# Patient Record
Sex: Male | Born: 1985 | Race: Black or African American | Hispanic: No | Marital: Single | Smoking: Never smoker
Health system: Southern US, Community
[De-identification: ages and names within clinical notes are randomized; demographics above are authoritative.]

## PROBLEM LIST (undated history)

## (undated) DIAGNOSIS — J45909 Unspecified asthma, uncomplicated: Secondary | ICD-10-CM

## (undated) DIAGNOSIS — I10 Essential (primary) hypertension: Secondary | ICD-10-CM

## (undated) DIAGNOSIS — J302 Other seasonal allergic rhinitis: Secondary | ICD-10-CM

## (undated) HISTORY — DX: Essential (primary) hypertension: I10

---
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
## 2013-01-19 ENCOUNTER — Other Ambulatory Visit: Payer: Self-pay | Admitting: Internal Medicine

## 2013-01-19 DIAGNOSIS — R1030 Lower abdominal pain, unspecified: Secondary | ICD-10-CM

## 2013-02-01 ENCOUNTER — Other Ambulatory Visit: Payer: Self-pay

## 2013-04-15 ENCOUNTER — Emergency Department (HOSPITAL_COMMUNITY)
Admission: EM | Admit: 2013-04-15 | Discharge: 2013-04-16 | Disposition: A | Discharge status: Home / Self Care | Payer: BC Managed Care – PPO | Attending: Emergency Medicine | Admitting: Emergency Medicine

## 2013-04-15 ENCOUNTER — Encounter (HOSPITAL_COMMUNITY): Payer: Self-pay | Admitting: Emergency Medicine

## 2013-04-15 DIAGNOSIS — R102 Pelvic and perineal pain: Secondary | ICD-10-CM

## 2013-04-15 DIAGNOSIS — J45909 Unspecified asthma, uncomplicated: Secondary | ICD-10-CM | POA: Insufficient documentation

## 2013-04-15 DIAGNOSIS — R350 Frequency of micturition: Secondary | ICD-10-CM | POA: Insufficient documentation

## 2013-04-15 DIAGNOSIS — R109 Unspecified abdominal pain: Secondary | ICD-10-CM | POA: Insufficient documentation

## 2013-04-15 DIAGNOSIS — Z9089 Acquired absence of other organs: Secondary | ICD-10-CM | POA: Insufficient documentation

## 2013-04-15 HISTORY — DX: Other seasonal allergic rhinitis: J30.2

## 2013-04-15 HISTORY — DX: Unspecified asthma, uncomplicated: J45.909

## 2013-04-15 LAB — URINALYSIS, ROUTINE W REFLEX MICROSCOPIC
Bilirubin Urine: NEGATIVE
Glucose, UA: NEGATIVE mg/dL
Hgb urine dipstick: NEGATIVE
Ketones, ur: NEGATIVE mg/dL
LEUKOCYTES UA: NEGATIVE
NITRITE: NEGATIVE
PROTEIN: NEGATIVE mg/dL
SPECIFIC GRAVITY, URINE: 1.016 (ref 1.005–1.030)
UROBILINOGEN UA: 1 mg/dL (ref 0.0–1.0)
pH: 5.5 (ref 5.0–8.0)

## 2013-04-15 LAB — GLUCOSE, CAPILLARY: Glucose-Capillary: 86 mg/dL (ref 70–99)

## 2013-04-15 NOTE — ED Notes (Signed)
Pt reports sharp suprapubic pain and frequent urination x1 week - pt denies any fever/chills/bodyaches or penile discharge.

## 2013-04-16 LAB — GC/CHLAMYDIA PROBE AMP
CT PROBE, AMP APTIMA: NEGATIVE
GC PROBE AMP APTIMA: NEGATIVE

## 2013-04-16 NOTE — ED Provider Notes (Signed)
Medical screening examination/treatment/procedure(s) were performed by non-physician practitioner and as supervising physician I was immediately available for consultation/collaboration.    Sunnie NielsenBrian Shallyn Constancio, MD 04/16/13 [phone]

## 2013-04-16 NOTE — Discharge Instructions (Signed)
1. Medications: usual home medications 2. Treatment: rest, drink plenty of fluids,  3. Follow Up: Please followup with your primary doctor for discussion of your diagnoses and further evaluation after today's visit; if you do not have a primary care doctor use the resource guide provided to find one;     Urinary Frequency The number of times a normal person urinates depends upon how much liquid they take in and how much liquid they are losing. If the temperature is hot and there is high humidity then the person will sweat more and usually breathe a little more frequently. These factors decrease the amount of frequency of urination that would be considered normal. The amount you drink is easily determined, but the amount of fluid lost is sometimes more difficult to calculate.  Fluid is lost in two ways:  Sensible fluid loss is usually measured by the amount of urine that you get rid of. Losses of fluid can also occur with diarrhea.  Insensible fluid loss is more difficult to measure. It is caused by evaporation. Insensible loss of fluid occurs through breathing and sweating. It usually ranges from a little less than a quart to a little more than a quart of fluid a day. In normal temperatures and activity levels the average person may urinate 4 to 7 times in a 24-hour period. Needing to urinate more often than that could indicate a problem. If one urinates 4 to 7 times in 24 hours and has large volumes each time, that could indicate a different problem from one who urinates 4 to 7 times a day and has small volumes. The time of urinating is also an important. Most urinating should be done during the waking hours. Getting up at night to urinate frequently can indicate some problems. CAUSES  The bladder is the organ in your lower abdomen that holds urine. Like a balloon, it swells some as it fills up. Your nerves sense this and tell you it is time to head for the bathroom. There are a number of reasons  that you might feel the need to urinate more often than usual. They include:  Urinary tract infection. This is usually associated with other signs such as burning when you urinate.  In men, problems with the prostate (a walnut-size gland that is located near the tube that carries urine out of your body). There are two reasons why the prostate can cause an increased frequency of urination:  An enlarged prostate that does not let the bladder empty well. If the bladder only half empties when you urinate then it only has half the capacity to fill before you have to urinate again.  The nerves in the bladder become more hypersensitive with an increased size of the prostate even if the bladder empties completely.  Pregnancy.  Obesity. Excess weight is more likely to cause a problem for women more than for men.  Bladder stones or other bladder problems.  Caffeine.  Alcohol.  Medications. For example, drugs that help the body get rid of extra fluid (diuretics) increase urine production. Some other medicines must be taken with lots of fluids.  Muscle or nerve weakness. This might be the result of a spinal cord injury, a stroke, multiple sclerosis or Parkinson's disease.  Long-standing diabetes can decrease the sensation of the bladder. This loss of sensation makes it harder to sense the bladder needs to be emptied. Over a period of years the bladder is stretched out by constant overfilling. This weakens the bladder muscles  so that the bladder does not empty well and has less capacity to fill with new urine.  Interstitial cystitis (also called painful bladder syndrome). This condition develops because the tissues that line the insider of the bladder are inflamed (inflammation is the body's way of reacting to injury or infection). It causes pain and frequent urination. It occurs in women more often than in men. DIAGNOSIS   To decide what might be causing your urinary frequency, your healthcare  provider will probably:  Ask about symptoms you have noticed.  Ask about your overall health. This will include questions about any medications you are taking.  Do a physical examination.  Order some tests. These might include:  A blood test to check for diabetes or other health issues that could be contributing to the problem.  Urine testing. This could measure the flow of urine and the pressure on the bladder.  A test of your neurological system (the brain, spinal cord and nerves). This is the system that senses the need to urinate.  A bladder test to check whether it is emptying completely when you urinate.  Cytoscopy. This test uses a thin tube with a tiny camera on it. It offers a look inside your urethra and bladder to see if there are problems.  Imaging tests. You might be given a contrast dye and then asked to urinate. X-rays are taken to see how your bladder is working. TREATMENT  It is important for you to be evaluated to determine if the amount or frequency that you have is unusual or abnormal. If it is found to be abnormal the cause should be determined and this can usually be found out easily. Depending upon the cause treatment could include medication, stimulation of the nerves, or surgery. There are not too many things that you can do as an individual to change your urinary frequency. It is important that you balance the amount of fluid intake needed to compensate for your activity and the temperature. Medical problems will be diagnosed and taken care of by your physician. There is no particular bladder training such as Kegel's exercises that you can do to help urinary frequency. This is an exercise this is usually done for people who have leaking of urine when they laugh cough or sneeze. HOME CARE INSTRUCTIONS   Take any medications your healthcare provider prescribed or suggested. Follow the directions carefully.  Practice any lifestyle changes that are recommended. These  might include:  Drinking less fluid or drinking at different times of the day. If you need to urinate often during the night, for example, you may need to stop drinking fluids early in the evening.  Cutting down on caffeine or alcohol. They both can make you need to urinate more often than normal. Caffeine is found in coffee, tea and sodas.  Losing weight, if that is recommended.  Keep a journal or a log. You might be asked to record how much you drink and when and when you feel the need to urinate. This will also help evaluate how well the treatment provided by your physician is working. SEEK MEDICAL CARE IF:   Your need to urinate often gets worse.  You feel increased pain or irritation when you urinate.  You notice blood in your urine.  You have questions about any medications that your healthcare provider recommended.  You notice blood, pus or swelling at the site of any test or treatment procedure.  You develop a fever of more than 100.5 F (  38.1 C). SEEK IMMEDIATE MEDICAL CARE IF:  You develop a fever of more than 102.0 F (38.9 C). Document Released: 12/14/2008 Document Revised: 05/12/2011 Document Reviewed: 12/14/2008 [facility] Patient Information 2014 [place], [place].

## 2013-04-16 NOTE — ED Provider Notes (Signed)
CSN: [id number]     Arrival date & time 04/15/13  2159 History   First MD Initiated Contact with Patient 04/15/13 2348     Chief Complaint  Patient presents with  . Abdominal Pain     (Consider location/radiation/quality/duration/timing/severity/associated sxs/prior Treatment) Patient is a 28 y.o. male presenting with abdominal pain. The history is provided by the patient and medical records. No language interpreter was used.  Abdominal Pain Associated symptoms: no chest pain, no constipation, no cough, no diarrhea, no dysuria, no fatigue, no fever, no hematuria, no nausea, no shortness of breath and no vomiting     Dale Patterson is a 28 y.o. male  with a hx of asthma, seasonal allergies presents to the Emergency Department complaining of gradual, persistent, progressively worsening urinary frequency onset 1 week ago. Associated symptoms include suprapubic abd pain.  Reports he had these symptoms several months ago and was seen by his primary care physician who ruled out a urinary tract infection and diabetes. The primary care recommended urology followup if the patient continued to have symptoms, but the symptoms resolved until just several days ago. Nothing makes it better and nothing makes it worse.  Pt denies fever, chills, headache, neck pain, chest pain, SOB, N/V/D, weakness, dizziness, syncope, hematuria.  Patient is sexually active with one male partner.  He reports this is his only lifetime partner, he has never had an STD and his partner lives out of town therefore he's had no sexual intercourse in approximately 2 months.   Past Medical History  Diagnosis Date  . Asthma   . Seasonal allergies    Past Surgical History  Procedure Laterality Date  . Appendectomy     No family history on file. History  Substance Use Topics  . Smoking status: Never Smoker   . Smokeless tobacco: Not on file  . Alcohol Use: Yes     Comment: occasionally    Review of Systems  Constitutional:  Negative for fever, diaphoresis, appetite change, fatigue and unexpected weight change.  HENT: Negative for mouth sores.   Eyes: Negative for visual disturbance.  Respiratory: Negative for cough, chest tightness, shortness of breath and wheezing.   Cardiovascular: Negative for chest pain.  Gastrointestinal: Positive for abdominal pain. Negative for nausea, vomiting, diarrhea and constipation.  Endocrine: Negative for polydipsia, polyphagia and polyuria.  Genitourinary: Positive for frequency. Negative for dysuria, urgency and hematuria.  Musculoskeletal: Negative for back pain and neck stiffness.  Skin: Negative for rash.  Allergic/Immunologic: Negative for immunocompromised state.  Neurological: Negative for syncope, light-headedness and headaches.  Hematological: Does not bruise/bleed easily.  Psychiatric/Behavioral: Negative for sleep disturbance. The patient is not nervous/anxious.       Allergies  Review of patient's allergies indicates no known allergies.  Home Medications   Current Outpatient Rx  Name  Route  Sig  Dispense  Refill  . Multiple Vitamin (MULTIVITAMIN WITH MINERALS) TABS tablet   Oral   Take 1 tablet by mouth daily.          BP 146/97  Pulse 64  Temp(Src) 98.4 F (36.9 C) (Oral)  Resp 16  Ht 5\' 11"  (1.803 m)  Wt 175 lb (79.379 kg)  BMI 24.42 kg/m2  SpO2 98% Physical Exam  Nursing note and vitals reviewed. Constitutional: He is oriented to person, place, and time. He appears well-developed and well-nourished. No distress.  Awake, alert, nontoxic appearance  HENT:  Head: Normocephalic and atraumatic.  Mouth/Throat: Oropharynx is clear and moist. No oropharyngeal exudate.  Eyes:  Conjunctivae are normal. No scleral icterus.  Neck: Normal range of motion. Neck supple.  Cardiovascular: Normal rate, regular rhythm, normal heart sounds and intact distal pulses.   No murmur heard. Pulmonary/Chest: Effort normal and breath sounds normal. No respiratory  distress. He has no wheezes.  Abdominal: Soft. Bowel sounds are normal. He exhibits no distension and no mass. There is tenderness in the suprapubic area. There is no rebound, no guarding and no CVA tenderness. Hernia confirmed negative in the right inguinal area and confirmed negative in the left inguinal area.    Mild abd tenderness without guarding, rebound or peritoneal signs in the very low suprapubic region.    Genitourinary: Testes normal and penis normal.    Cremasteric reflex is present. Right testis shows no mass, no swelling and no tenderness. Right testis is descended. Cremasteric reflex is not absent on the right side. Left testis shows no mass, no swelling and no tenderness. Left testis is descended. Cremasteric reflex is not absent on the left side. No phimosis, paraphimosis, hypospadias, penile erythema or penile tenderness. No discharge found.  Pt with most intense discomfort with palpation to the right of the penis.  No palpable direct/indirect inguinal hernia or femoral hernia; no adenopathy.  No erythema, induration or evidence of abscess or cellulitis.  Otherwise normal GU exam.    Musculoskeletal: Normal range of motion. He exhibits no edema.  Lymphadenopathy:       Right: No inguinal adenopathy present.       Left: No inguinal adenopathy present.  Neurological: He is alert and oriented to person, place, and time. He exhibits normal muscle tone. Coordination normal.  Speech is clear and goal oriented Moves extremities without ataxia  Skin: Skin is warm and dry. He is not diaphoretic. No erythema.  Psychiatric: He has a normal mood and affect.    ED Course  Procedures (including critical care time) Labs Review Labs Reviewed  GC/CHLAMYDIA PROBE AMP  URINALYSIS, ROUTINE W REFLEX MICROSCOPIC  GLUCOSE, CAPILLARY   Imaging Review No results found.  EKG Interpretation   None       MDM   Final diagnoses:  Urinary frequency  Suprapubic abdominal pain     Dale Patterson presents with urinary frequency and suprapubic pain it is not associated with his urination or dysuria.  UA without evidence of urinary tract infection.  CBG 86 and no evidence of diabetes. GC Chlamydia probe collected and pending. Patient is low risk for STD and I will not treat prophylactically today but we'll wait on culture.  No evidence of hernia on exam.  Pre-and post bladder scan with 84 ML's pre-void and 26 ML's post void.  No evidence of urinary retention.  Recommend urology followup for his symptoms at this time.  It has been determined that no acute conditions requiring further emergency intervention are present at this time. The patient/guardian have been advised of the diagnosis and plan. We have discussed signs and symptoms that warrant return to the ED, such as changes or worsening in symptoms.   Vital signs are stable at discharge.   BP 146/97  Pulse 64  Temp(Src) 98.4 F (36.9 C) (Oral)  Resp 16  Ht 5\' 11"  (1.803 m)  Wt 175 lb (79.379 kg)  BMI 24.42 kg/m2  SpO2 98%  Patient/guardian has voiced understanding and agreed to follow-up with the PCP or specialist.       Dierdre Forth, PA-C 04/16/13 0148

## 2013-04-16 NOTE — ED Notes (Signed)
Bladder scan: Pre-void=84 Post Void=26

## 2013-12-22 ENCOUNTER — Ambulatory Visit (INDEPENDENT_AMBULATORY_CARE_PROVIDER_SITE_OTHER): Payer: Self-pay | Admitting: Radiology

## 2013-12-22 ENCOUNTER — Ambulatory Visit (INDEPENDENT_AMBULATORY_CARE_PROVIDER_SITE_OTHER): Payer: BC Managed Care – PPO | Admitting: Neurology

## 2013-12-22 DIAGNOSIS — G609 Hereditary and idiopathic neuropathy, unspecified: Secondary | ICD-10-CM

## 2013-12-22 DIAGNOSIS — R202 Paresthesia of skin: Secondary | ICD-10-CM

## 2013-12-22 DIAGNOSIS — Z0289 Encounter for other administrative examinations: Secondary | ICD-10-CM

## 2013-12-22 NOTE — Progress Notes (Signed)
    [facility]    Provider:  Dr Lucia GaskinsAhern Referring Provider: Georgianne Fickamachandran, Ajith, MD  History: 28 year old who is here for evaluation of numbness. Numbness started 2 months ago in the feet and hands. Symptoms occur daily, some days feel like it is getting worse. More numbness,pain than tingling or burning. Symptoms are in the tips of all the fingers and all the toes. No radiation. Better when laying in bed and worse with walking. No neck pain, no low back pain. No cramping. No weakness, not dropping objects. Focused exam demonstrates decreased sensation to pin prick distally in the extremities, intact proprioception in the great toes, intact and symmetric reflexes and 5/5 DF/PF. Toes downgoing.  Summary  Nerve conduction studies were performed on the left upper and left lower extremities:  The left Median motor nerve showed normal conductions with normal F Wave latency The left Ulnar motor nerve showed normal conductions with normal F Wave latency The left Peroneal motor nerve showed normal conductions with normal F Wave latency The left Tibial motor nerve showed normal conductions with normal F Wave latency The left second-digit Median sensory nerve conduction was within normal limits The left fifth-digit Ulnar sensory nerve conduction was within normal limits The left Radial sensory nerve conduction was within normal limits The left Sural sensory nerve conduction was within normal limits Bilateral H Reflexes showed normal latencies The left Median/Ulnar (palm) comparison nerve showed normal peak latency difference  EMG Needle study was performed on selected left upper and left lower extremity muscles:   The Deltoid, Triceps, Pronator Teres, Opponens Pollicis, First Dorsal interosseous, Vastus Medialis, Anterior Tibialis, Medial Gastrocnemius, Extensor Hallucis Longus and Abductor Hallucis muscles were within normal limits.  Conclusion: This is a normal study. No  electrophysiologic evidence for ulnar or median neuropathy, peripheral polyneuropathy or radiculopathy.. There is no evidence of polyneuropathy however a small fiber neuropathy could be responsible for such symptoms and still evade detection by this study. Clinical correlation recommended.   Naomie DeanAntonia Ahern, MD  [facility] [address] [place], [place] [id number]  Phone [phone] Fax [phone]

## 2013-12-26 ENCOUNTER — Encounter: Payer: BC Managed Care – PPO | Admitting: Neurology

## 2014-02-19 ENCOUNTER — Encounter (HOSPITAL_COMMUNITY): Payer: Self-pay | Admitting: Emergency Medicine

## 2014-02-19 ENCOUNTER — Emergency Department (HOSPITAL_COMMUNITY)
Admission: EM | Admit: 2014-02-19 | Discharge: 2014-02-19 | Disposition: A | Discharge status: Home / Self Care | Payer: BC Managed Care – PPO | Attending: Emergency Medicine | Admitting: Emergency Medicine

## 2014-02-19 DIAGNOSIS — J45909 Unspecified asthma, uncomplicated: Secondary | ICD-10-CM | POA: Insufficient documentation

## 2014-02-19 DIAGNOSIS — R350 Frequency of micturition: Secondary | ICD-10-CM

## 2014-02-19 LAB — URINALYSIS, ROUTINE W REFLEX MICROSCOPIC
Bilirubin Urine: NEGATIVE
Glucose, UA: NEGATIVE mg/dL
Hgb urine dipstick: NEGATIVE
KETONES UR: NEGATIVE mg/dL
LEUKOCYTES UA: NEGATIVE
Nitrite: NEGATIVE
PROTEIN: NEGATIVE mg/dL
Specific Gravity, Urine: 1.016 (ref 1.005–1.030)
Urobilinogen, UA: 0.2 mg/dL (ref 0.0–1.0)
pH: 5 (ref 5.0–8.0)

## 2014-02-19 NOTE — ED Notes (Signed)
Pt arrived to the ED a complaint of urinary frequency.  Pt was seen 5 weeks ago for same given cipro but symptoms have not abated.  Pt also states he has pain in his pubis.

## 2014-02-19 NOTE — Discharge Instructions (Signed)
At times, urinary frequency and a sensation of incomplete bladder emptying may be secondary to benign prostatic hyperplasia (an enlarged prostate). Recommend your primary doctor conduct a PSA test to see if you may be at risk for BPH. Your work up today did not suggest a urinary tract infection or prostatitis. You have gonorrhea and chlamydia cultures pending. Follow-up with the health department in 48 hours for results of this test. Recommend a follow-up with urology for further evaluation of her symptoms. Return to the emergency department as needed if symptoms worsen.  Urinary Frequency The number of times a normal person urinates depends upon how much liquid they take in and how much liquid they are losing. If the temperature is hot and there is high humidity, then the person will sweat more and usually breathe a little more frequently. These factors decrease the amount of frequency of urination that would be considered normal. The amount you drink is easily determined, but the amount of fluid lost is sometimes more difficult to calculate.  Fluid is lost in two ways:  Sensible fluid loss is usually measured by the amount of urine that you get rid of. Losses of fluid can also occur with diarrhea.  Insensible fluid loss is more difficult to measure. It is caused by evaporation. Insensible loss of fluid occurs through breathing and sweating. It usually ranges from a little less than a quart to a little more than a quart of fluid a day. In normal temperatures and activity levels, the average person may urinate 4 to 7 times in a 24-hour period. Needing to urinate more often than that could indicate a problem. If one urinates 4 to 7 times in 24 hours and has large volumes each time, that could indicate a different problem from one who urinates 4 to 7 times a day and has small volumes. The time of urinating is also important. Most urinating should be done during the waking hours. Getting up at night to  urinate frequently can indicate some problems. CAUSES  The bladder is the organ in your lower abdomen that holds urine. Like a balloon, it swells some as it fills up. Your nerves sense this and tell you it is time to head for the bathroom. There are a number of reasons that you might feel the need to urinate more often than usual. They include:  Urinary tract infection. This is usually associated with other signs such as burning when you urinate.  In men, problems with the prostate (a walnut-size gland that is located near the tube that carries urine out of your body). There are two reasons why the prostate can cause an increased frequency of urination:  An enlarged prostate that does not let the bladder empty well. If the bladder only half empties when you urinate, then it only has half the capacity to fill before you have to urinate again.  The nerves in the bladder become more hypersensitive with an increased size of the prostate even if the bladder empties completely.  Pregnancy.  Obesity. Excess weight is more likely to cause a problem for women than for men.  Bladder stones or other bladder problems.  Caffeine.  Alcohol.  Medications. For example, drugs that help the body get rid of extra fluid (diuretics) increase urine production. Some other medicines must be taken with lots of fluids.  Muscle or nerve weakness. This might be the result of a spinal cord injury, a stroke, multiple sclerosis, or Parkinson disease.  Long-standing diabetes can decrease  the sensation of the bladder. This loss of sensation makes it harder to sense the bladder needs to be emptied. Over a period of years, the bladder is stretched out by constant overfilling. This weakens the bladder muscles so that the bladder does not empty well and has less capacity to fill with new urine.  Interstitial cystitis (also called painful bladder syndrome). This condition develops because the tissues that line the inside of  the bladder are inflamed (inflammation is the body's way of reacting to injury or infection). It causes pain and frequent urination. It occurs in women more often than in men. DIAGNOSIS   To decide what might be causing your urinary frequency, your health care provider will probably:  Ask about symptoms you have noticed.  Ask about your overall health. This will include questions about any medications you are taking.  Do a physical examination.  Order some tests. These might include:  A blood test to check for diabetes or other health issues that could be contributing to the problem.  Urine testing. This could measure the flow of urine and the pressure on the bladder.  A test of your neurological system (the brain, spinal cord, and nerves). This is the system that senses the need to urinate.  A bladder test to check whether it is emptying completely when you urinate.  Cystoscopy. This test uses a thin tube with a tiny camera on it. It offers a look inside your urethra and bladder to see if there are problems.  Imaging tests. You might be given a contrast dye and then asked to urinate. X-rays are taken to see how your bladder is working. TREATMENT  It is important for you to be evaluated to determine if the amount or frequency that you have is unusual or abnormal. If it is found to be abnormal, the cause should be determined and this can usually be found out easily. Depending upon the cause, treatment could include medication, stimulation of the nerves, or surgery. There are not too many things that you can do as an individual to change your urinary frequency. It is important that you balance the amount of fluid intake needed to compensate for your activity and the temperature. Medical problems will be diagnosed and taken care of by your physician. There is no particular bladder training such as Kegel exercises that you can do to help urinary frequency. This is an exercise that is usually  recommended for people who have leaking of urine when they laugh, cough, or sneeze. HOME CARE INSTRUCTIONS   Take any medications your health care provider prescribed or suggested. Follow the directions carefully.  Practice any lifestyle changes that are recommended. These might include:  Drinking less fluid or drinking at different times of the day. If you need to urinate often during the night, for example, you may need to stop drinking fluids early in the evening.  Cutting down on caffeine or alcohol. They both can make you need to urinate more often than normal. Caffeine is found in coffee, tea, and sodas.  Losing weight, if that is recommended.  Keep a journal or a log. You might be asked to record how much you drink and when and where you feel the need to urinate. This will also help evaluate how well the treatment provided by your physician is working. SEEK MEDICAL CARE IF:   Your need to urinate often gets worse.  You feel increased pain or irritation when you urinate.  You notice blood in  your urine.  You have questions about any medications that your health care provider recommended.  You notice blood, pus, or swelling at the site of any test or treatment procedure.  You develop a fever of more than 100.11F (38.1C). SEEK IMMEDIATE MEDICAL CARE IF:  You develop a fever of more than 102.59F (38.9C). Document Released: 12/14/2008 Document Revised: 07/04/2013 Document Reviewed: 12/14/2008 [facility] Patient Information 2015 [place], [place]. This information is not intended to replace advice given to you by your health care provider. Make sure you discuss any questions you have with your health care provider.

## 2014-02-19 NOTE — ED Provider Notes (Signed)
CSN: [id number]     Arrival date & time 02/19/14  0220 History   First MD Initiated Contact with Patient 02/19/14 0236     Chief Complaint  Patient presents with  . Urinary Frequency    (Consider location/radiation/quality/duration/timing/severity/associated sxs/prior Treatment) HPI Comments: Patient is a 28 year old male with no significant past medical history who presents to the emergency department for further evaluation of urinary frequency 5 weeks. Patient states that his symptoms have been associated with the sensation of incomplete bladder emptying. He denies any modifying factors of his symptoms and states that he saw his primary care doctor who thought his symptoms were secondary to prostatitis. Patient was put on a 6 day course of ciprofloxacin which she states did not alleviate his symptoms. He has been referred to urology, but is unable to follow up until the middle of January. He states he does intermittently feel an aching discomfort in his suprapubic region. He denies any associated fever, dysuria, hematuria, scrotal swelling, penile swelling or discharge, nausea, vomiting, or a history of unprotected sexual intercourse. He states that he has been sexually active with 2 partners in the last year, always with the use of condoms.  Patient is a 28 y.o. male presenting with frequency. The history is provided by the patient. No language interpreter was used.  Urinary Frequency Pertinent negatives include no abdominal pain, fever, nausea or vomiting.    Past Medical History  Diagnosis Date  . Asthma   . Seasonal allergies    Past Surgical History  Procedure Laterality Date  . Appendectomy     History reviewed. No pertinent family history. History  Substance Use Topics  . Smoking status: Never Smoker   . Smokeless tobacco: Not on file  . Alcohol Use: Yes     Comment: occasionally    Review of Systems  Constitutional: Negative for fever.  Gastrointestinal: Negative for  nausea, vomiting and abdominal pain.  Genitourinary: Positive for frequency. Negative for dysuria, penile swelling, scrotal swelling, penile pain and testicular pain.       +sensation of incomplete bladder emptying  All other systems reviewed and are negative.   Allergies  Review of patient's allergies indicates no known allergies.  Home Medications   Prior to Admission medications   Medication Sig Start Date End Date Taking? Authorizing Provider  Multiple Vitamin (MULTIVITAMIN WITH MINERALS) TABS tablet Take 1 tablet by mouth daily.    Historical Provider, MD   BP 129/85 mmHg  Pulse 72  Temp(Src) 97.8 F (36.6 C) (Oral)  Resp 19  SpO2 100%   Physical Exam  Constitutional: He is oriented to person, place, and time. He appears well-developed and well-nourished. No distress.  Nontoxic/nonseptic appearing  HENT:  Head: Normocephalic and atraumatic.  Eyes: Conjunctivae and EOM are normal. No scleral icterus.  Neck: Normal range of motion.  Pulmonary/Chest: Effort normal. No respiratory distress.  Respirations even and unlabored  Abdominal: Soft. He exhibits no distension. There is no tenderness. There is no rebound. Hernia confirmed negative in the right inguinal area and confirmed negative in the left inguinal area.  Soft, nontender. No masses.  Genitourinary: Prostate normal, testes normal and penis normal. Rectal exam shows no tenderness and anal tone normal. Prostate is not tender. Right testis shows no mass, no swelling and no tenderness. Right testis is descended. Left testis shows no mass, no swelling and no tenderness. Left testis is descended. Circumcised. No penile erythema or penile tenderness. No discharge found.  Chaperoned GU exam negative for boggy  or tender prostate. Palpated prostate does not feel enlarged. No penile or testicular TTP. No inguinal lymphadenopathy or hernia.  Musculoskeletal: Normal range of motion.  Lymphadenopathy:       Right: No inguinal  adenopathy present.       Left: No inguinal adenopathy present.  Neurological: He is alert and oriented to person, place, and time. He exhibits normal muscle tone. Coordination normal.  Skin: Skin is warm and dry. No rash noted. He is not diaphoretic. No erythema. No pallor.  Psychiatric: He has a normal mood and affect. His behavior is normal.  Nursing note and vitals reviewed.   ED Course  Procedures (including critical care time) Labs Review Labs Reviewed  URINALYSIS, ROUTINE W REFLEX MICROSCOPIC - Abnormal; Notable for the following:    APPearance CLOUDY (*)    All other components within normal limits  GC/CHLAMYDIA PROBE AMP    Imaging Review No results found.   EKG Interpretation None      MDM   Final diagnoses:  Urinary frequency    Patient presents to the emergency department for further evaluation of urinary frequency with a sensation of incomplete bladder emptying. Patient has been put on a child with ciprofloxacin by his primary care doctor with no relief of his symptoms. He was instructed to follow-up with urology, but was unable to get an appointment until the middle of January. Patient's physical exam today is largely unremarkable. His urinalysis does not suggest infection. Gonorrhea and chlamydia culture pending, though I have a low suspicion for STDs in this patient. Patient does not have a tender or boggy prostate on chaperoned rectal exam. Also low suspicion for acute prostatitis. Patient to be referred to urology for further evaluation of symptoms. Have recommended that the patient have his primary care doctor run a PSA test to evaluate for possible BPH. Return precautions discussed and provided. Patient agreeable to plan with no unaddressed concerns. Patient discharged in good condition; VSS.   Filed Vitals:   02/19/14 0229 02/19/14 0333  BP: 132/88 129/85  Pulse: 66 72  Temp: 97.8 F (36.6 C) 97.8 F (36.6 C)  TempSrc: Oral Oral  Resp: 16 19  SpO2: 99%  100%     Antony MaduraKelly Lousie Calico, PA-C 02/19/14 0345  Derwood KaplanAnkit Nanavati, MD 02/19/14 [phone]

## 2014-02-21 LAB — GC/CHLAMYDIA PROBE AMP
CT PROBE, AMP APTIMA: NEGATIVE
GC Probe RNA: NEGATIVE

## 2014-05-27 ENCOUNTER — Encounter (HOSPITAL_COMMUNITY): Payer: Self-pay | Admitting: Emergency Medicine

## 2014-05-27 ENCOUNTER — Emergency Department (HOSPITAL_COMMUNITY)
Admission: EM | Admit: 2014-05-27 | Discharge: 2014-05-27 | Disposition: A | Discharge status: Home / Self Care | Payer: BLUE CROSS/BLUE SHIELD | Attending: Emergency Medicine | Admitting: Emergency Medicine

## 2014-05-27 ENCOUNTER — Emergency Department (HOSPITAL_COMMUNITY): Payer: BLUE CROSS/BLUE SHIELD

## 2014-05-27 DIAGNOSIS — J111 Influenza due to unidentified influenza virus with other respiratory manifestations: Secondary | ICD-10-CM

## 2014-05-27 DIAGNOSIS — R531 Weakness: Secondary | ICD-10-CM | POA: Insufficient documentation

## 2014-05-27 DIAGNOSIS — J45909 Unspecified asthma, uncomplicated: Secondary | ICD-10-CM | POA: Insufficient documentation

## 2014-05-27 DIAGNOSIS — M2550 Pain in unspecified joint: Secondary | ICD-10-CM | POA: Insufficient documentation

## 2014-05-27 DIAGNOSIS — R51 Headache: Secondary | ICD-10-CM | POA: Diagnosis present

## 2014-05-27 DIAGNOSIS — R509 Fever, unspecified: Secondary | ICD-10-CM | POA: Insufficient documentation

## 2014-05-27 DIAGNOSIS — R5383 Other fatigue: Secondary | ICD-10-CM | POA: Insufficient documentation

## 2014-05-27 DIAGNOSIS — R42 Dizziness and giddiness: Secondary | ICD-10-CM | POA: Diagnosis not present

## 2014-05-27 DIAGNOSIS — I951 Orthostatic hypotension: Secondary | ICD-10-CM | POA: Diagnosis not present

## 2014-05-27 DIAGNOSIS — R69 Illness, unspecified: Secondary | ICD-10-CM

## 2014-05-27 DIAGNOSIS — Z79899 Other long term (current) drug therapy: Secondary | ICD-10-CM | POA: Diagnosis not present

## 2014-05-27 DIAGNOSIS — R630 Anorexia: Secondary | ICD-10-CM | POA: Insufficient documentation

## 2014-05-27 DIAGNOSIS — Z7951 Long term (current) use of inhaled steroids: Secondary | ICD-10-CM | POA: Diagnosis not present

## 2014-05-27 DIAGNOSIS — M791 Myalgia: Secondary | ICD-10-CM | POA: Insufficient documentation

## 2014-05-27 LAB — CBC WITH DIFFERENTIAL/PLATELET
BASOS ABS: 0 10*3/uL (ref 0.0–0.1)
BASOS PCT: 0 % (ref 0–1)
EOS ABS: 0 10*3/uL (ref 0.0–0.7)
Eosinophils Relative: 0 % (ref 0–5)
HCT: 44 % (ref 39.0–52.0)
Hemoglobin: 15.1 g/dL (ref 13.0–17.0)
Lymphocytes Relative: 9 % — ABNORMAL LOW (ref 12–46)
Lymphs Abs: 0.6 10*3/uL — ABNORMAL LOW (ref 0.7–4.0)
MCH: 27.9 pg (ref 26.0–34.0)
MCHC: 34.3 g/dL (ref 30.0–36.0)
MCV: 81.3 fL (ref 78.0–100.0)
Monocytes Absolute: 0.3 10*3/uL (ref 0.1–1.0)
Monocytes Relative: 5 % (ref 3–12)
NEUTROS ABS: 5.5 10*3/uL (ref 1.7–7.7)
Neutrophils Relative %: 86 % — ABNORMAL HIGH (ref 43–77)
Platelets: 160 10*3/uL (ref 150–400)
RBC: 5.41 MIL/uL (ref 4.22–5.81)
RDW: 13 % (ref 11.5–15.5)
WBC: 6.5 10*3/uL (ref 4.0–10.5)

## 2014-05-27 LAB — COMPREHENSIVE METABOLIC PANEL
ALBUMIN: 4.1 g/dL (ref 3.5–5.2)
ALT: 33 U/L (ref 0–53)
AST: 28 U/L (ref 0–37)
Alkaline Phosphatase: 51 U/L (ref 39–117)
Anion gap: 7 (ref 5–15)
BUN: 13 mg/dL (ref 6–23)
CHLORIDE: 105 mmol/L (ref 96–112)
CO2: 24 mmol/L (ref 19–32)
Calcium: 8.5 mg/dL (ref 8.4–10.5)
Creatinine, Ser: 1.15 mg/dL (ref 0.50–1.35)
GFR calc Af Amer: >90 mL/min (ref >90)
GFR calc non Af Amer: 85 mL/min — ABNORMAL LOW (ref >90)
Glucose, Bld: 98 mg/dL (ref 70–99)
POTASSIUM: 4 mmol/L (ref 3.5–5.1)
SODIUM: 136 mmol/L (ref 135–145)
TOTAL PROTEIN: 7.4 g/dL (ref 6.0–8.3)
Total Bilirubin: 0.8 mg/dL (ref 0.3–1.2)

## 2014-05-27 LAB — URINALYSIS, ROUTINE W REFLEX MICROSCOPIC
Bilirubin Urine: NEGATIVE
GLUCOSE, UA: NEGATIVE mg/dL
Hgb urine dipstick: NEGATIVE
Ketones, ur: NEGATIVE mg/dL
Leukocytes, UA: NEGATIVE
NITRITE: NEGATIVE
PH: 5 (ref 5.0–8.0)
PROTEIN: NEGATIVE mg/dL
Specific Gravity, Urine: 1.019 (ref 1.005–1.030)
Urobilinogen, UA: 1 mg/dL (ref 0.0–1.0)

## 2014-05-27 MED ORDER — IBUPROFEN 800 MG PO TABS
800.0000 mg | ORAL_TABLET | Freq: Once | ORAL | Status: AC
  Administered 2014-05-27: 800 mg via ORAL
  Filled 2014-05-27: qty 1

## 2014-05-27 MED ORDER — SODIUM CHLORIDE 0.9 % IV BOLUS (SEPSIS)
1000.0000 mL | Freq: Once | INTRAVENOUS | Status: AC
  Administered 2014-05-27: 1000 mL via INTRAVENOUS

## 2014-05-27 NOTE — ED Notes (Signed)
Pt from home vis EMS c/o HA and chills that started today. Pt has not taken any OTC meds for relief. Pt is A&O and in NAD

## 2014-05-27 NOTE — ED Notes (Signed)
Bed: [id number] Expected date: 05/27/14 Expected time: 10:24 AM Means of arrival: Ambulance Comments: Weakness/orthostatic changes

## 2014-05-27 NOTE — Discharge Instructions (Signed)
Viral Infections Keep yourself hydrated. Follow up with your doctor. Return to the ED if you develop new or worsening symptoms. A viral infection can be caused by different types of viruses.Most viral infections are not serious and resolve on their own. However, some infections may cause severe symptoms and may lead to further complications. SYMPTOMS Viruses can frequently cause:  Minor sore throat.  Aches and pains.  Headaches.  Runny nose.  Different types of rashes.  Watery eyes.  Tiredness.  Cough.  Loss of appetite.  Gastrointestinal infections, resulting in nausea, vomiting, and diarrhea. These symptoms do not respond to antibiotics because the infection is not caused by bacteria. However, you might catch a bacterial infection following the viral infection. This is sometimes called a "superinfection." Symptoms of such a bacterial infection may include:  Worsening sore throat with pus and difficulty swallowing.  Swollen neck glands.  Chills and a high or persistent fever.  Severe headache.  Tenderness over the sinuses.  Persistent overall ill feeling (malaise), muscle aches, and tiredness (fatigue).  Persistent cough.  Yellow, green, or brown mucus production with coughing. HOME CARE INSTRUCTIONS   Only take over-the-counter or prescription medicines for pain, discomfort, diarrhea, or fever as directed by your caregiver.  Drink enough water and fluids to keep your urine clear or pale yellow. Sports drinks can provide valuable electrolytes, sugars, and hydration.  Get plenty of rest and maintain proper nutrition. Soups and broths with crackers or rice are fine. SEEK IMMEDIATE MEDICAL CARE IF:   You have severe headaches, shortness of breath, chest pain, neck pain, or an unusual rash.  You have uncontrolled vomiting, diarrhea, or you are unable to keep down fluids.  You or your child has an oral temperature above 102 F (38.9 C), not controlled by  medicine.  Your baby is older than 3 months with a rectal temperature of 102 F (38.9 C) or higher.  Your baby is 453 months old or younger with a rectal temperature of 100.4 F (38 C) or higher. MAKE SURE YOU:   Understand these instructions.  Will watch your condition.  Will get help right away if you are not doing well or get worse. Document Released: 11/27/2004 Document Revised: 05/12/2011 Document Reviewed: 06/24/2010 [facility] Patient Information 2015 [place], [place]. This information is not intended to replace advice given to you by your health care provider. Make sure you discuss any questions you have with your health care provider.

## 2014-05-27 NOTE — ED Notes (Signed)
Pt sts that he woke up this am dizzy with a HA. Pt denies N/V/D. Pt is A&O and in NAD

## 2014-05-27 NOTE — ED Provider Notes (Signed)
CSN: [id number]     Arrival date & time 05/27/14  1026 History   First MD Initiated Contact with Patient 05/27/14 1042     Chief Complaint  Patient presents with  . Headache  . Chills     (Consider location/radiation/quality/duration/timing/severity/associated sxs/prior Treatment) HPI Comments: Patient from home with generalized weakness, dizziness, headache and body aches since this morning. He felt fine when he went to bed last night. Did not check his temperature. Endorses diffuse headache with body aches and lightheadedness. Denies any nausea, vomiting, diarrhea. No chest pain or shortness of breath. No cough. No runny nose or sore throat. Denies any chronic medical problems other than hypertension and acid reflux. He does not take any blood pressure medications. He did not receive a flu shot. Denies any sick contacts.  The history is provided by the patient and the EMS personnel.    Past Medical History  Diagnosis Date  . Asthma   . Seasonal allergies    Past Surgical History  Procedure Laterality Date  . Appendectomy     No family history on file. History  Substance Use Topics  . Smoking status: Never Smoker   . Smokeless tobacco: Not on file  . Alcohol Use: Yes     Comment: occasionally    Review of Systems  Constitutional: Positive for fever, chills, activity change, appetite change and fatigue.  HENT: Negative for congestion and rhinorrhea.   Respiratory: Negative for cough, chest tightness and shortness of breath.   Cardiovascular: Negative for chest pain.  Gastrointestinal: Negative for nausea, vomiting and abdominal pain.  Genitourinary: Negative for dysuria and hematuria.  Musculoskeletal: Positive for myalgias and arthralgias. Negative for neck stiffness.  Skin: Negative for rash.  Neurological: Positive for weakness, light-headedness and headaches.  A complete 10 system review of systems was obtained and all systems are negative except as noted in the HPI  and PMH.      Allergies  Review of patient's allergies indicates no known allergies.  Home Medications   Prior to Admission medications   Medication Sig Start Date End Date Taking? Authorizing Provider  acetaminophen (TYLENOL) 500 MG tablet Take 500 mg by mouth every 6 (six) hours as needed for mild pain.   Yes Historical Provider, MD  fluticasone (FLONASE) 50 MCG/ACT nasal spray Place 1 spray into both nostrils daily.  04/24/14  Yes Historical Provider, MD  Multiple Vitamin (MULTIVITAMIN WITH MINERALS) TABS tablet Take 1 tablet by mouth daily.   Yes Historical Provider, MD  omeprazole (PRILOSEC) 40 MG capsule Take 40 mg by mouth daily.  05/20/14  Yes Historical Provider, MD  vitamin B-12 (CYANOCOBALAMIN) 1000 MCG tablet Take 1,000 mcg by mouth daily.   Yes Historical Provider, MD   BP 121/73 mmHg  Pulse 85  Temp(Src) 98.7 F (37.1 C) (Oral)  Resp 18  SpO2 98% Physical Exam  Constitutional: He is oriented to person, place, and time. He appears well-developed and well-nourished. No distress.  HENT:  Head: Normocephalic and atraumatic.  Mouth/Throat: Oropharynx is clear and moist. No oropharyngeal exudate.  Eyes: Conjunctivae and EOM are normal. Pupils are equal, round, and reactive to light.  Neck: Normal range of motion. Neck supple.  No meningismus.  Cardiovascular: Normal rate, regular rhythm, normal heart sounds and intact distal pulses.   No murmur heard. Pulmonary/Chest: Effort normal and breath sounds normal. No respiratory distress. He exhibits no tenderness.  Abdominal: Soft. There is no tenderness. There is no rebound and no guarding.  Musculoskeletal: Normal range of  motion. He exhibits no edema or tenderness.  Neurological: He is alert and oriented to person, place, and time. No cranial nerve deficit. He exhibits normal muscle tone. Coordination normal.  No ataxia on finger to nose bilaterally. No pronator drift. 5/5 strength throughout. CN 2-12 intact. Negative  Romberg. Equal grip strength. Sensation intact. Gait is normal.   Skin: Skin is warm.  Psychiatric: He has a normal mood and affect. His behavior is normal.  Nursing note and vitals reviewed.   ED Course  Procedures (including critical care time) Labs Review Labs Reviewed  CBC WITH DIFFERENTIAL/PLATELET - Abnormal; Notable for the following:    Neutrophils Relative % 86 (*)    Lymphocytes Relative 9 (*)    Lymphs Abs 0.6 (*)    All other components within normal limits  COMPREHENSIVE METABOLIC PANEL - Abnormal; Notable for the following:    GFR calc non Af Amer 85 (*)    All other components within normal limits  URINALYSIS, ROUTINE W REFLEX MICROSCOPIC    Imaging Review Dg Chest 2 View  05/27/2014   CLINICAL DATA:  Chills and headache.  Fever  EXAM: CHEST  2 VIEW  COMPARISON:  None.  FINDINGS: Lungs are clear. Heart size and pulmonary vascularity are normal. No adenopathy. 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  IMPRESSION: No edema or consolidation.   Electronically Signed   By: Bretta BangWilliam  Woodruff III M.D.   On: 05/27/2014 11:21     EKG Interpretation None      MDM   Final diagnoses:  Influenza-like illness  Orthostasis   Chills, dizziness, lightheadedness, body aches and headache since this morning. Afebrile nontoxic appearing moist mucous membranes. Vital stable.  Suspect viral syndrome, possibly influenza. Orthostatics are positive by heart rate. No meningismus.  Patient given IV fluids. He is tolerating by mouth by mouth. Heart rate is improved to 85. Chest x-ray negative, labs reassuring.  Suspect viral syndrome, possibly influenza. Risks and benefits of Tamiflu discussed with patient. He declines. Supportive care at home with by mouth fluids, antipyretics and follow up with PCP. Return precautions discussed.   Glynn OctaveStephen Addie Cederberg, MD 05/27/14 [phone]

## 2014-06-12 ENCOUNTER — Encounter: Payer: Self-pay | Admitting: Neurology

## 2014-06-12 ENCOUNTER — Ambulatory Visit (INDEPENDENT_AMBULATORY_CARE_PROVIDER_SITE_OTHER): Payer: BLUE CROSS/BLUE SHIELD | Admitting: Neurology

## 2014-06-12 VITALS — BP 130/82 | HR 90 | Temp 98.2°F | Ht 71.0 in | Wt 179.0 lb

## 2014-06-12 DIAGNOSIS — R519 Headache, unspecified: Secondary | ICD-10-CM

## 2014-06-12 DIAGNOSIS — R51 Headache: Secondary | ICD-10-CM | POA: Diagnosis not present

## 2014-06-12 DIAGNOSIS — G4452 New daily persistent headache (NDPH): Secondary | ICD-10-CM

## 2014-06-12 MED ORDER — NORTRIPTYLINE HCL 10 MG PO CAPS: 20.0000 mg | ORAL_CAPSULE | Freq: Every day | ORAL | Status: DC

## 2014-06-12 NOTE — Progress Notes (Signed)
[facility]    Provider:  Dr Lucia Gaskins Referring Provider: Georgianne Fick, MD Primary Care Physician:  Georgianne Fick, MD  CC:  Headache  HPI:  Dale Patterson is a 29 y.o. male here as a referral from Dr. Nicholos Johns for Headache. He was seen in the office last year for paresthesias that have since resolved. Today he returns for Migraines and pain in the head. No history of migraines. 3-4 weeks ago started having headaches. One morning he woke up with a headache. Since then daily headache. Tylenol. Throbbing, pulsating. Can last 20-30 minutes.  every day. Different parts of the head. multipe times a day. He has stress from his job but nothing else. No new medications. Taking a muscle relaxer and hydrocodone. Can get to an 8/10 in pain. Hydrocodone helps. No light sensitivity. No sound sensitivity. Has sharp shooting pains behind the eyes. Has difficulty getting to sleep at night. Sleeping 5-6 hours a night. He has some increased stress in his life currently.   Reviewed notes, labs and imaging from outside physicians, which showed: CMP unremarkable  Review of Systems: Patient complains of symptoms per HPI as well as the following symptoms: eye pain, headache. Pertinent negatives per HPI. All others negative.   History   Social History  . Marital Status: Single    Spouse Name: N/A  . Number of Children: 0  . Years of Education: Assocaites   Occupational History  . Certified opthalamic assistant     Social History Main Topics  . Smoking status: Never Smoker   . Smokeless tobacco: Not on file  . Alcohol Use: 0.0 oz/week    0 Standard drinks or equivalent per week     Comment: 3-4 drinks per week  . Drug Use: No  . Sexual Activity: Yes   Other Topics Concern  . Not on file   Social History Narrative   Lives at home by himself.   Right handed.    Caffeine use: 2-3 cups of coffee/week    Family History  Problem Relation Age of Onset  . Diabetes  Maternal Grandmother   . Hypertension Mother   . Hypertension Father   . High Cholesterol Father     Past Medical History  Diagnosis Date  . Asthma   . Seasonal allergies   . Hypertension     Past Surgical History  Procedure Laterality Date  . Appendectomy  2001  . Hernia repair  1992    Current Outpatient Prescriptions  Medication Sig Dispense Refill  . acetaminophen (TYLENOL) 500 MG tablet Take 500 mg by mouth every 6 (six) hours as needed for mild pain.    . cyclobenzaprine (FLEXERIL) 10 MG tablet Take 10 mg by mouth every 8 (eight) hours.  1  . fluticasone (FLONASE) 50 MCG/ACT nasal spray Place 1 spray into both nostrils daily.   5  . HYDROcodone-acetaminophen (NORCO) 10-325 MG per tablet Take 1 tablet by mouth every 6 (six) hours as needed.  0  . Multiple Vitamin (MULTIVITAMIN WITH MINERALS) TABS tablet Take 1 tablet by mouth daily.    Marland Kitchen omeprazole (PRILOSEC) 40 MG capsule Take 40 mg by mouth daily.   5  . vitamin B-12 (CYANOCOBALAMIN) 1000 MCG tablet Take 1,000 mcg by mouth daily.     No current facility-administered medications for this visit.    Allergies as of 06/12/2014  . (No Known Allergies)    Vitals: BP 130/82 mmHg  Pulse 90  Temp(Src) 98.2 F (36.8 C)  Ht  (1.803  m)  Wt 179 lb (81.194 kg)  BMI 24.98 kg/m2 Last Weight:  Wt Readings from Last 1 Encounters:  06/12/14 179 lb (81.194 kg)   Last Height:   Ht Readings from Last 1 Encounters:  06/12/14 5\' 11"  (1.803 m)   Physical exam: Exam: Gen: NAD, conversant, well nourised, well groomed                     CV: RRR, no MRG. No Carotid Bruits. No peripheral edema, warm, nontender Eyes: Conjunctivae clear without exudates or hemorrhage  Neuro: Detailed Neurologic Exam  Speech:    Speech is normal; fluent and spontaneous with normal comprehension.  Cognition:    The patient is oriented to person, place, and time;     recent and remote memory intact;     language fluent;     normal  attention, concentration,     fund of knowledge Cranial Nerves:    The pupils are equal, round, and reactive to light. The fundi are normal and spontaneous venous pulsations are present. Visual fields are full to finger confrontation. Extraocular movements are intact. Trigeminal sensation is intact and the muscles of mastication are normal. The face is symmetric. The palate elevates in the midline. Hearing intact. Voice is normal. Shoulder shrug is normal. The tongue has normal motion without fasciculations.   Coordination:    Normal finger to nose and heel to shin. Normal rapid alternating movements.   Gait:    Heel-toe and tandem gait are normal.   Motor Observation:    No asymmetry, no atrophy, and no involuntary movements noted. Tone:    Normal muscle tone.    Posture:    Posture is normal. normal erect    Strength:    Strength is V/V in the upper and lower limbs.      Sensation: intact to LT     Reflex Exam:  DTR's:    Deep tendon reflexes in the upper and lower extremities are normal bilaterally.   Toes:    The toes are downgoing bilaterally.   Clonus:    Clonus is absent.  Assessment/Plan:  29 year old male here with daily headaches in the setting of new stress and poor sleeping. Neuro exam normal. Will start Nortriptyline 10mg  around bedtime. In one week can increase to 20mg  if needed.As far as diagnostic testing: CT head.   Naomie DeanAntonia Rynell Ciotti, MD  [facility] [address] [place] [id number]  Phone [phone] Fax [phone]

## 2014-06-12 NOTE — Patient Instructions (Signed)
Overall you are doing fairly well but I do want to suggest a few things today:   Remember to drink plenty of fluid, eat healthy meals and do not skip any meals. Try to eat protein with a every meal and eat a healthy snack such as fruit or nuts in between meals. Try to keep a regular sleep-wake schedule and try to exercise daily, particularly in the form of walking, 20-30 minutes a day, if you can.   As far as your medications are concerned, I would like to suggest: Nortriptyline 10mg  around bedtime. In one week can increase to 20mg  if needed.  As far as diagnostic testing: CT head  I would like to see you back in 3 months, sooner if we need to. Please call us with any interim questions, concerns, problems, updates or refill requests.   Please also call us for any test results so we can go over those with you on the phone.  My clinical assistant and will answer any of your questions and relay your messages to me and also relay most of my messages to you.   Our phone number is [phone]. We also have an after hours call service for urgent matters and there is a physician on-call for urgent questions. For any emergencies you know to call 911 or go to the nearest emergency room

## 2014-06-17 DIAGNOSIS — R519 Headache, unspecified: Secondary | ICD-10-CM | POA: Insufficient documentation

## 2014-06-17 DIAGNOSIS — R51 Headache: Secondary | ICD-10-CM

## 2014-11-25 ENCOUNTER — Encounter (HOSPITAL_COMMUNITY): Payer: Self-pay | Admitting: Oncology

## 2014-11-25 ENCOUNTER — Emergency Department (HOSPITAL_COMMUNITY)
Admission: EM | Admit: 2014-11-25 | Discharge: 2014-11-26 | Disposition: A | Discharge status: Home / Self Care | Payer: BLUE CROSS/BLUE SHIELD | Attending: Emergency Medicine | Admitting: Emergency Medicine

## 2014-11-25 DIAGNOSIS — R1013 Epigastric pain: Secondary | ICD-10-CM | POA: Diagnosis present

## 2014-11-25 DIAGNOSIS — I10 Essential (primary) hypertension: Secondary | ICD-10-CM | POA: Insufficient documentation

## 2014-11-25 DIAGNOSIS — Z79899 Other long term (current) drug therapy: Secondary | ICD-10-CM | POA: Diagnosis not present

## 2014-11-25 DIAGNOSIS — J45909 Unspecified asthma, uncomplicated: Secondary | ICD-10-CM | POA: Insufficient documentation

## 2014-11-25 DIAGNOSIS — Z7951 Long term (current) use of inhaled steroids: Secondary | ICD-10-CM | POA: Diagnosis not present

## 2014-11-25 DIAGNOSIS — Z9089 Acquired absence of other organs: Secondary | ICD-10-CM | POA: Diagnosis not present

## 2014-11-25 DIAGNOSIS — N451 Epididymitis: Secondary | ICD-10-CM | POA: Diagnosis not present

## 2014-11-25 LAB — BASIC METABOLIC PANEL
Anion gap: 6 (ref 5–15)
BUN: 10 mg/dL (ref 6–20)
CALCIUM: 9.3 mg/dL (ref 8.9–10.3)
CO2: 26 mmol/L (ref 22–32)
CREATININE: 1.09 mg/dL (ref 0.61–1.24)
Chloride: 107 mmol/L (ref 101–111)
GFR calc Af Amer: >60 mL/min (ref >60)
GFR calc non Af Amer: >60 mL/min (ref >60)
GLUCOSE: 99 mg/dL (ref 65–99)
Potassium: 4.2 mmol/L (ref 3.5–5.1)
Sodium: 139 mmol/L (ref 135–145)

## 2014-11-25 NOTE — ED Notes (Signed)
Per pt he presents d/t flank pain that radiates to his back, abdomen and groin, frequent urination.  Pt is A&O x 4.  Rates pain 8/10, intermittent and sharp.

## 2014-11-26 LAB — URINALYSIS, ROUTINE W REFLEX MICROSCOPIC
Bilirubin Urine: NEGATIVE
Glucose, UA: NEGATIVE mg/dL
Hgb urine dipstick: NEGATIVE
KETONES UR: NEGATIVE mg/dL
Leukocytes, UA: NEGATIVE
NITRITE: NEGATIVE
Protein, ur: NEGATIVE mg/dL
SPECIFIC GRAVITY, URINE: 1.018 (ref 1.005–1.030)
Urobilinogen, UA: 0.2 mg/dL (ref 0.0–1.0)
pH: 5 (ref 5.0–8.0)

## 2014-11-26 MED ORDER — DOXYCYCLINE HYCLATE 100 MG PO TABS
100.0000 mg | ORAL_TABLET | Freq: Once | ORAL | Status: AC
  Administered 2014-11-26: 100 mg via ORAL
  Filled 2014-11-26: qty 1

## 2014-11-26 MED ORDER — DOXYCYCLINE HYCLATE 100 MG PO CAPS: 100.0000 mg | ORAL_CAPSULE | Freq: Two times a day (BID) | ORAL | Status: DC

## 2014-11-26 MED ORDER — NAPROXEN 375 MG PO TABS: ORAL_TABLET | ORAL | Status: DC

## 2014-11-26 MED ORDER — NAPROXEN 500 MG PO TABS
250.0000 mg | ORAL_TABLET | Freq: Once | ORAL | Status: AC
  Administered 2014-11-26: 250 mg via ORAL
  Filled 2014-11-26: qty 1

## 2014-11-26 MED ORDER — OMEPRAZOLE 20 MG PO CPDR: DELAYED_RELEASE_CAPSULE | ORAL | Status: DC

## 2014-11-26 NOTE — Discharge Instructions (Signed)
Take the antibiotics until gone. You can take the naproxen for pain. For your upper abdominal pain take the prilosec as prescribed. If your pain in your groin isn't improving in the next 2-3 days, consider being evaluated by Dr Vernie Ammons, a urologist. Call his office to get an appointment. ff you get a fever, vomiting, worsening pain in your groin or seem worse, return to the ED.    Epididymitis Epididymitis is a swelling (inflammation) of the epididymis. The epididymis is a cord-like structure along the back part of the testicle. Epididymitis is usually, but not always, caused by infection. This is usually a sudden problem beginning with chills, fever and pain behind the scrotum and in the testicle. There may be swelling and redness of the testicle. DIAGNOSIS  Physical examination will reveal a tender, swollen epididymis. Sometimes, cultures are obtained from the urine or from prostate secretions to help find out if there is an infection or if the cause is a different problem. Sometimes, blood work is performed to see if your white blood cell count is elevated and if a germ (bacterial) or viral infection is present. Using this knowledge, an appropriate medicine which kills germs (antibiotic) can be chosen by your caregiver. A viral infection causing epididymitis will most often go away (resolve) without treatment. HOME CARE INSTRUCTIONS   Hot sitz baths for 20 minutes, 4 times per day, may help relieve pain.  Only take over-the-counter or prescription medicines for pain, discomfort or fever as directed by your caregiver.  Take all medicines, including antibiotics, as directed. Take the antibiotics for the full prescribed length of time even if you are feeling better.  It is very important to keep all follow-up appointments. SEEK IMMEDIATE MEDICAL CARE IF:   You have a fever.  You have pain not relieved with medicines.  You have any worsening of your problems.  Your pain seems to come and  go.  You develop pain, redness, and swelling in the scrotum and surrounding areas. MAKE SURE YOU:   Understand these instructions.  Will watch your condition.  Will get help right away if you are not doing well or get worse. Document Released: 02/15/2000 Document Revised: 05/12/2011 Document Reviewed: 01/04/2009 [facility] Patient Information 2015 [place], [place]. This information is not intended to replace advice given to you by your health care provider. Make sure you discuss any questions you have with your health care provider.

## 2014-11-26 NOTE — ED Provider Notes (Signed)
CSN: [id number]     Arrival date & time 11/25/14  2136 History  This chart was scribed for Dale Albe, MD by [name], ED Scribe. This patient was seen in room WA03/WA03 and the patient's care was started at 12:30 AM.     Chief Complaint  Patient presents with  . Flank Pain   The history is provided by the patient. No language interpreter was used.    HPI Comments: Dale Patterson is a 29 y.o. male who presents to the Emergency Department complaining of moderate, intermittent, sharp epigastric pain, lasting 10 minutes at a time, that sometimes radiates to the upper and lower back onset 3 days ago. He states associated frequency of urination. He notes no modifying factors of the abdominal pain. He reports that he ate today and it did not worsen his abdominal pain. He also has pain in his inguinal area bilaterally. He has no pain or swelling in his scrotum. No Hx of similar symptoms. Pt is a non-smoker. He is an occasional drinker. 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 He denies dysuria, fever, chills, penile discharge, nausea, vomiting, diarrhea, scrotal pain.    PCP is Dr. Nicholos Johns  Past Medical History  Diagnosis Date  . Asthma   . Seasonal allergies   . Hypertension    Past Surgical History  Procedure Laterality Date  . Appendectomy  2001  . Hernia repair  1992   Family History  Problem Relation Age of Onset  . Diabetes Maternal Grandmother   . Hypertension Mother   . Hypertension Father   . High Cholesterol Father    Social History  Substance Use Topics  . Smoking status: Never Smoker   . Smokeless tobacco: Never Used  . Alcohol Use: 0.0 oz/week    0 Standard drinks or equivalent per week     Comment: 3-4 drinks per week  employed  Review of Systems  Constitutional: Negative for fever and chills.  Gastrointestinal: Positive for abdominal pain. Negative for nausea, vomiting and diarrhea.  Genitourinary: Positive for frequency.  Negative for dysuria, discharge and testicular pain.  Musculoskeletal: Positive for back pain.  All other systems reviewed and are negative.  Allergies  Review of patient's allergies indicates no known allergies.  Home Medications   Prior to Admission medications   Medication Sig Start Date End Date Taking? Authorizing Provider  acetaminophen (TYLENOL) 500 MG tablet Take 500 mg by mouth every 6 (six) hours as needed for mild pain.    Historical Provider, MD  cyclobenzaprine (FLEXERIL) 10 MG tablet Take 10 mg by mouth every 8 (eight) hours. 06/01/14   Historical Provider, MD  doxycycline (VIBRAMYCIN) 100 MG capsule Take 1 capsule (100 mg total) by mouth 2 (two) times daily. 11/26/14   Dale Albe, MD  fluticasone (FLONASE) 50 MCG/ACT nasal spray Place 1 spray into both nostrils daily.  04/24/14   Historical Provider, MD  HYDROcodone-acetaminophen (NORCO) 10-325 MG per tablet Take 1 tablet by mouth every 6 (six) hours as needed. 06/01/14   Historical Provider, MD  Multiple Vitamin (MULTIVITAMIN WITH MINERALS) TABS tablet Take 1 tablet by mouth daily.    Historical Provider, MD  naproxen (NAPROSYN) 375 MG tablet Take 1 po BID with food prn pain 11/26/14   Dale Albe, MD  nortriptyline (PAMELOR) 10 MG capsule Take 2 capsules (20 mg total) by mouth at bedtime. 06/12/14   Anson Fret, MD  omeprazole (PRILOSEC) 20 MG capsule Take 1 po BID x 2  weeks then once a day 11/26/14   Dale Albe, MD  vitamin B-12 (CYANOCOBALAMIN) 1000 MCG tablet Take 1,000 mcg by mouth daily.    Historical Provider, MD   BP 124/88 mmHg  Pulse 75  Temp(Src) 98.1 F (36.7 C) (Oral)  Resp 15  Ht  (1.778 m)  Wt 175 lb (79.379 kg)  BMI 25.11 kg/m2  SpO2 99%  Vital signs normal   Physical Exam  Constitutional: He is oriented to person, place, and time. He appears well-developed and well-nourished.  Non-toxic appearance. He does not appear ill. No distress.  HENT:  Head: Normocephalic and atraumatic.  Right Ear:  External ear normal.  Left Ear: External ear normal.  Nose: Nose normal. No mucosal edema or rhinorrhea.  Mouth/Throat: Oropharynx is clear and moist and mucous membranes are normal. No dental abscesses or uvula swelling.  Eyes: Conjunctivae and EOM are normal. Pupils are equal, round, and reactive to light.  Neck: Normal range of motion and full passive range of motion without pain. Neck supple.  Cardiovascular: Normal rate, regular rhythm and normal heart sounds.  Exam reveals no gallop and no friction rub.   No murmur heard. Pulmonary/Chest: Effort normal and breath sounds normal. No respiratory distress. He has no wheezes. He has no rhonchi. He has no rales. He exhibits no tenderness and no crepitus.  Abdominal: Soft. Normal appearance and bowel sounds are normal. He exhibits no distension. There is tenderness in the epigastric area. There is no rebound and no guarding.    No CVA tenderness. Tender bilaterally over the epididymis that reproduces his complaints of pain. No masses, no lymphadenopathy.    Musculoskeletal: Normal range of motion. He exhibits no edema or tenderness.  Moves all extremities well.   Neurological: He is alert and oriented to person, place, and time. He has normal strength. No cranial nerve deficit.  Skin: Skin is warm, dry and intact. No rash noted. No erythema. No pallor.  Psychiatric: He has a normal mood and affect. His speech is normal and behavior is normal. His mood appears not anxious.  Nursing note and vitals reviewed.  ED Course  Procedures (including critical care time)  Medications  doxycycline (VIBRA-TABS) tablet 100 mg (100 mg Oral Given 11/26/14 0051)  naproxen (NAPROSYN) tablet 250 mg (250 mg Oral Given 11/26/14 0051)    DIAGNOSTIC STUDIES: Oxygen Saturation is 99% on RA, normal by my interpretation.    COORDINATION OF CARE: 12:38 AM Discussed treatment plan with pt at bedside and pt agreed to plan. Informed pt of return precautions.     Patient has been on Prilosec in the past. He was advised to take it twice a day for the next 2 weeks then once a day. This should help with his epigastric pain. He was started on doxycycline for his epididymitis. There is no complaints to suggest orchitis.  Labs Review Results for orders placed or performed during the hospital encounter of 11/25/14  Urinalysis, Routine w reflex microscopic-may I&O cath if menses (not at [facility])  Result Value Ref Range   Color, Urine YELLOW YELLOW   APPearance CLEAR CLEAR   Specific Gravity, Urine 1.018 1.005 - 1.030   pH 5.0 5.0 - 8.0   Glucose, UA NEGATIVE NEGATIVE mg/dL   Hgb urine dipstick NEGATIVE NEGATIVE   Bilirubin Urine NEGATIVE NEGATIVE   Ketones, ur NEGATIVE NEGATIVE mg/dL   Protein, ur NEGATIVE NEGATIVE mg/dL   Urobilinogen, UA 0.2 0.0 - 1.0 mg/dL   Nitrite NEGATIVE NEGATIVE  Leukocytes, UA NEGATIVE NEGATIVE  Basic metabolic panel  Result Value Ref Range   Sodium 139 135 - 145 mmol/L   Potassium 4.2 3.5 - 5.1 mmol/L   Chloride 107 101 - 111 mmol/L   CO2 26 22 - 32 mmol/L   Glucose, Bld 99 65 - 99 mg/dL   BUN 10 6 - 20 mg/dL   Creatinine, Ser 3.53 0.61 - 1.24 mg/dL   Calcium 9.3 8.9 - 61.4 mg/dL   GFR calc non Af Amer >60 >60 mL/min   GFR calc Af Amer >60 >60 mL/min   Anion gap 6 5 - 15   Laboratory interpretation all normal     Imaging Review No results found. I have personally reviewed and evaluated these images and lab results as part of my medical decision-making.   EKG Interpretation None      MDM   Final diagnoses:  Epididymitis, bilateral  Epigastric abdominal pain    Discharge Medication List as of 11/26/2014 12:46 AM    START taking these medications   Details  doxycycline (VIBRAMYCIN) 100 MG capsule Take 1 capsule (100 mg total) by mouth 2 (two) times daily., Starting 11/26/2014, Until Discontinued, Print    naproxen (NAPROSYN) 375 MG tablet Take 1 po BID with food prn pain, Print       Plan  discharge  Dale Albe, MD, FACEP   I personally performed the services described in this documentation, which was scribed in my presence. The recorded information has been reviewed and considered.  Dale Albe, MD, Concha Pyo, MD 11/26/14 [phone]

## 2015-11-02 ENCOUNTER — Emergency Department (HOSPITAL_COMMUNITY)
Admission: EM | Admit: 2015-11-02 | Discharge: 2015-11-02 | Disposition: A | Discharge status: Home / Self Care | Payer: 59 | Attending: Emergency Medicine | Admitting: Emergency Medicine

## 2015-11-02 ENCOUNTER — Encounter (HOSPITAL_COMMUNITY): Payer: Self-pay | Admitting: *Deleted

## 2015-11-02 ENCOUNTER — Emergency Department (HOSPITAL_COMMUNITY): Payer: 59

## 2015-11-02 DIAGNOSIS — N400 Enlarged prostate without lower urinary tract symptoms: Secondary | ICD-10-CM

## 2015-11-02 DIAGNOSIS — K5900 Constipation, unspecified: Secondary | ICD-10-CM

## 2015-11-02 DIAGNOSIS — Z79899 Other long term (current) drug therapy: Secondary | ICD-10-CM | POA: Diagnosis not present

## 2015-11-02 DIAGNOSIS — J45909 Unspecified asthma, uncomplicated: Secondary | ICD-10-CM | POA: Diagnosis not present

## 2015-11-02 DIAGNOSIS — I10 Essential (primary) hypertension: Secondary | ICD-10-CM | POA: Insufficient documentation

## 2015-11-02 DIAGNOSIS — R1084 Generalized abdominal pain: Secondary | ICD-10-CM

## 2015-11-02 LAB — URINALYSIS, ROUTINE W REFLEX MICROSCOPIC
Bilirubin Urine: NEGATIVE
GLUCOSE, UA: NEGATIVE mg/dL
HGB URINE DIPSTICK: NEGATIVE
Ketones, ur: NEGATIVE mg/dL
Leukocytes, UA: NEGATIVE
Nitrite: NEGATIVE
PH: 6.5 (ref 5.0–8.0)
Protein, ur: NEGATIVE mg/dL
SPECIFIC GRAVITY, URINE: 1.027 (ref 1.005–1.030)

## 2015-11-02 LAB — CBC WITH DIFFERENTIAL/PLATELET
Basophils Absolute: 0 10*3/uL (ref 0.0–0.1)
Basophils Relative: 1 %
Eosinophils Absolute: 0.1 10*3/uL (ref 0.0–0.7)
Eosinophils Relative: 2 %
HCT: 40.3 % (ref 39.0–52.0)
HEMOGLOBIN: 13.9 g/dL (ref 13.0–17.0)
LYMPHS ABS: 1.6 10*3/uL (ref 0.7–4.0)
LYMPHS PCT: 41 %
MCH: 27.8 pg (ref 26.0–34.0)
MCHC: 34.5 g/dL (ref 30.0–36.0)
MCV: 80.6 fL (ref 78.0–100.0)
Monocytes Absolute: 0.3 10*3/uL (ref 0.1–1.0)
Monocytes Relative: 9 %
NEUTROS PCT: 47 %
Neutro Abs: 1.8 10*3/uL (ref 1.7–7.7)
Platelets: 177 10*3/uL (ref 150–400)
RBC: 5 MIL/uL (ref 4.22–5.81)
RDW: 13.1 % (ref 11.5–15.5)
WBC: 3.9 10*3/uL — AB (ref 4.0–10.5)

## 2015-11-02 LAB — BASIC METABOLIC PANEL
Anion gap: 5 (ref 5–15)
BUN: 14 mg/dL (ref 6–20)
CALCIUM: 9.1 mg/dL (ref 8.9–10.3)
CO2: 27 mmol/L (ref 22–32)
CREATININE: 1.4 mg/dL — AB (ref 0.61–1.24)
Chloride: 108 mmol/L (ref 101–111)
GFR calc Af Amer: >60 mL/min (ref >60)
GFR calc non Af Amer: >60 mL/min (ref >60)
GLUCOSE: 87 mg/dL (ref 65–99)
Potassium: 3.8 mmol/L (ref 3.5–5.1)
Sodium: 140 mmol/L (ref 135–145)

## 2015-11-02 LAB — POC OCCULT BLOOD, ED: Fecal Occult Bld: NEGATIVE

## 2015-11-02 MED ORDER — IBUPROFEN 800 MG PO TABS: 800.0000 mg | ORAL_TABLET | Freq: Three times a day (TID) | ORAL | 0 refills | Status: AC | PRN

## 2015-11-02 MED ORDER — IOPAMIDOL (ISOVUE-300) INJECTION 61%
100.0000 mL | Freq: Once | INTRAVENOUS | Status: AC | PRN
  Administered 2015-11-02: 100 mL via INTRAVENOUS

## 2015-11-02 MED ORDER — POLYETHYLENE GLYCOL 3350 17 G PO PACK: 17.0000 g | PACK | Freq: Every day | ORAL | 0 refills | Status: AC

## 2015-11-02 MED ORDER — IOPAMIDOL (ISOVUE-300) INJECTION 61%
30.0000 mL | Freq: Once | INTRAVENOUS | Status: AC | PRN
  Administered 2015-11-02: 15 mL via ORAL

## 2015-11-02 NOTE — ED Triage Notes (Signed)
Pt complains of abdominal pain for the past week. Pt denies n/v/d. Pt was put on dicyclomine 1 week ago, which he states has not helped.

## 2015-11-02 NOTE — ED Notes (Signed)
Patient states he has hx of acid reflux and takes pantoprazole for that.  For past 2 weeks c/o epigastric pain.  He saw the PA at his GI practice last Friday and was started on dicyclomine.  He is taking that, but with no relief.  Epigastric pain is intermittent in nature.  Patient denies N/V/D and fever.  Patient states, "I've had stomach problems for a long time."

## 2015-11-02 NOTE — ED Provider Notes (Signed)
[facility]-EMERGENCY DEPT Provider Note   CSN: [id number] Arrival date & time: 11/02/15  1435     History   Chief Complaint Chief Complaint  Patient presents with  . Abdominal Pain    HPI Dale Patterson is a 30 y.o. male.  HPI   Patient with hx acid reflux, IBS p/w abdominal pain x 2 weeks.  States the pain is intermittent, hurts in different parts of his abdomen, sharp in nature.  Had small amount and caliber of stool yesterday.  Has also had some low back pain.  Has been seen by [facility] last week was put on Bentyl without improvement.   Was told he needed a CT abd/pelvis.  Denies fevers, chills, CP, N/V, bowel changes, urinary symptoms, testicular pain or swelling.     Past Medical History:  Diagnosis Date  . Asthma   . Hypertension   . Seasonal allergies     Patient Active Problem List   Diagnosis Date Noted  . Daily headache 06/17/2014    Past Surgical History:  Procedure Laterality Date  . APPENDECTOMY  2001  . HERNIA REPAIR  1992       Home Medications    Prior to Admission medications   Medication Sig Start Date End Date Taking? Authorizing Provider  cetirizine (ZYRTEC) 10 MG tablet Take 10 mg by mouth daily.   Yes Historical Provider, MD  Cholecalciferol (VITAMIN D PO) Take 1 tablet by mouth daily.   Yes Historical Provider, MD  Omega-3 Fatty Acids (FISH OIL PO) Take 1 tablet by mouth daily.   Yes Historical Provider, MD  pantoprazole (PROTONIX) 40 MG tablet Take 40 mg by mouth daily.   Yes Historical Provider, MD  TURMERIC PO Take 1 tablet by mouth daily.   Yes Historical Provider, MD  doxycycline (VIBRAMYCIN) 100 MG capsule Take 1 capsule (100 mg total) by mouth 2 (two) times daily. Patient not taking: Reported on 11/02/2015 11/26/14   [name], MD  ibuprofen (ADVIL,MOTRIN) 800 MG tablet Take 1 tablet (800 mg total) by mouth every 8 (eight) hours as needed for mild pain or moderate pain. 11/02/15   Trixie Dredge, PA-C  naproxen (NAPROSYN) 375 MG tablet Take 1 po BID  with food prn pain Patient not taking: Reported on 11/02/2015 11/26/14   [name], MD  nortriptyline (PAMELOR) 10 MG capsule Take 2 capsules (20 mg total) by mouth at bedtime. Patient not taking: Reported on 11/02/2015 06/12/14   Anson Fret, MD  omeprazole (PRILOSEC) 20 MG capsule Take 1 po BID x 2 weeks then once a day Patient not taking: Reported on 11/02/2015 11/26/14   [name], MD  polyethylene glycol (MIRALAX / GLYCOLAX) packet Take 17 g by mouth daily. 11/02/15   Trixie Dredge, PA-C    Family History Family History  Problem Relation Age of Onset  . Hypertension Mother   . Hypertension Father   . High Cholesterol Father   . Diabetes Maternal Grandmother     Social History Social History  Substance Use Topics  . Smoking status: Never Smoker  . Smokeless tobacco: Never Used  . Alcohol use 0.0 oz/week     Comment: 3-4 drinks per week     Allergies   Review of patient's allergies indicates no known allergies.   Review of Systems Review of Systems  All other systems reviewed and are negative.    Physical Exam Updated Vital Signs BP 130/86 (BP Location: Right Arm)   Pulse 68   Temp 97.3 F (36.3  C) (Oral)   Resp 19   SpO2 100%   Physical Exam  Constitutional: He appears well-developed and well-nourished. No distress.  HENT:  Head: Normocephalic and atraumatic.  Neck: Neck supple.  Cardiovascular: Normal rate and regular rhythm.   Pulmonary/Chest: Effort normal and breath sounds normal. No respiratory distress. He has no wheezes. He has no rales.  Abdominal: Soft. Bowel sounds are normal. He exhibits no distension and no mass. There is no tenderness. There is no rebound and no guarding.  Genitourinary: Rectum normal. Rectal exam shows no external hemorrhoid, no mass and anal tone normal.  Genitourinary Comments: Rectal exam unremarkable.  No significant tenderness.  Unable to palpate significant part of prostate.    Neurological: He is alert. He exhibits normal muscle  tone.  Skin: He is not diaphoretic.  Nursing note and vitals reviewed.    ED Treatments / Results  Labs (all labs ordered are listed, but only abnormal results are displayed) Labs Reviewed  BASIC METABOLIC PANEL - Abnormal; Notable for the following:       Result Value   Creatinine, Ser 1.40 (*)    All other components within normal limits  CBC WITH DIFFERENTIAL/PLATELET - Abnormal; Notable for the following:    WBC 3.9 (*)    All other components within normal limits  URINALYSIS, ROUTINE W REFLEX MICROSCOPIC (NOT AT [facility])  RPR  HIV ANTIBODY (ROUTINE TESTING)  POC OCCULT BLOOD, ED  GC/CHLAMYDIA PROBE AMP ([place]) NOT AT [facility]    EKG  EKG Interpretation None       Radiology Ct Abdomen Pelvis W Contrast  Result Date: 11/02/2015 CLINICAL DATA:  Pt complains of abdominal pain for the past week. Pt denies nausea, vomiting, and diarrhea. Pt was put on dicyclomine 1 week ago, which he states has not helped. EXAM: CT ABDOMEN AND PELVIS WITH CONTRAST TECHNIQUE: Multidetector CT imaging of the abdomen and pelvis was performed using the standard protocol following bolus administration of intravenous contrast. CONTRAST:  15mL ISOVUE-300 IOPAMIDOL (ISOVUE-300) INJECTION 61%, 100mL ISOVUE-300 IOPAMIDOL (ISOVUE-300) INJECTION 61% COMPARISON:  None. FINDINGS: The lung bases are clear. Tiny sub cm low-attenuation lesion in segment 5 of the liver is too small to characterize but likely represents a cyst. The gallbladder, pancreas, spleen, adrenal glands, kidneys, abdominal aorta, inferior vena cava, and retroperitoneal lymph nodes are unremarkable. Stomach, small bowel, and colon are not abnormally distended. Stool fills the colon. No free air or free fluid in the abdomen. Pelvis: Appendix is surgically absent. Prostate gland is enlarged at 4.7 cm and seminal vesicles are prominent with somewhat heterogeneous appearance. Consider possibility of prostatitis. No pelvic lymphadenopathy. No free or  loculated pelvic fluid collections. Bladder wall is not thickened. No evidence of diverticulitis. No destructive bone lesions. IMPRESSION: No evidence of bowel obstruction or inflammation. Stool-filled colon may indicate constipation. Enlarged and somewhat heterogeneous prostate gland could indicate prostatitis. Electronically Signed   By: Burman NievesWilliam  Stevens M.D.   On: 11/02/2015 18:46    Procedures Procedures (including critical care time)  Medications Ordered in ED Medications  iopamidol (ISOVUE-300) 61 % injection 30 mL (15 mLs Oral Contrast Given 11/02/15 1700)  iopamidol (ISOVUE-300) 61 % injection 100 mL (100 mLs Intravenous Contrast Given 11/02/15 1834)     Initial Impression / Assessment and Plan / ED Course  I have reviewed the triage vital signs and the nursing notes.  Pertinent labs & imaging results that were available during my care of the patient were reviewed by me and considered in  my medical decision making (see chart for details).  Clinical Course  Comment By Time  I spoke with Celso Amy, Gastroenterology PA, who does recommend a CT scan for further evaluation of patient's abdominal pain.  Pt has been seen in office 4 times since Feb 2016, has had normal labs each time but returns with persistent complaints of pain.  Trixie Dredge, PA-C 09/01 1635   Afebrile, nontoxic patient with abdominal pain x 2 weeks, recommended to have CT by GI PA.  Pt nontender on exam.  No additional symptoms.  CT demonstrates constipation, enlarged prostate.  Rectal/prostate exam unremarkable, UA does not appear infected.  Doubt prostatitis.    D/C home with motrin, miralax, GI, PCP, Urology follow up.  Discussed result, findings, treatment, and follow up  with patient.  Pt given return precautions.  Pt verbalizes understanding and agrees with plan.       Final Clinical Impressions(s) / ED Diagnoses   Final diagnoses:  Generalized abdominal pain  Enlarged prostate  Constipation, unspecified  constipation type    New Prescriptions Discharge Medication List as of 11/02/2015  7:54 PM    START taking these medications   Details  ibuprofen (ADVIL,MOTRIN) 800 MG tablet Take 1 tablet (800 mg total) by mouth every 8 (eight) hours as needed for mild pain or moderate pain., Starting Fri 11/02/2015, Print    polyethylene glycol (MIRALAX / GLYCOLAX) packet Take 17 g by mouth daily., Starting Fri 11/02/2015, Print         [place], PA-C 11/02/15 2241    Pricilla Loveless, MD 11/04/15 [phone]

## 2015-11-03 LAB — RPR: RPR Ser Ql: NONREACTIVE

## 2015-11-03 LAB — HIV ANTIBODY (ROUTINE TESTING W REFLEX): HIV Screen 4th Generation wRfx: NONREACTIVE

## 2015-11-06 LAB — GC/CHLAMYDIA PROBE AMP (~~LOC~~) NOT AT ARMC
CHLAMYDIA, DNA PROBE: NEGATIVE
NEISSERIA GONORRHEA: NEGATIVE

## 2016-05-12 ENCOUNTER — Ambulatory Visit (HOSPITAL_COMMUNITY)
Admission: EM | Admit: 2016-05-12 | Discharge: 2016-05-12 | Disposition: A | Discharge status: Home / Self Care | Payer: BLUE CROSS/BLUE SHIELD | Attending: Emergency Medicine | Admitting: Emergency Medicine

## 2016-05-12 ENCOUNTER — Encounter (HOSPITAL_COMMUNITY): Payer: Self-pay | Admitting: *Deleted

## 2016-05-12 DIAGNOSIS — J45909 Unspecified asthma, uncomplicated: Secondary | ICD-10-CM | POA: Insufficient documentation

## 2016-05-12 DIAGNOSIS — R350 Frequency of micturition: Secondary | ICD-10-CM | POA: Diagnosis not present

## 2016-05-12 DIAGNOSIS — K5901 Slow transit constipation: Secondary | ICD-10-CM | POA: Diagnosis not present

## 2016-05-12 DIAGNOSIS — R300 Dysuria: Secondary | ICD-10-CM | POA: Diagnosis present

## 2016-05-12 DIAGNOSIS — I10 Essential (primary) hypertension: Secondary | ICD-10-CM | POA: Diagnosis not present

## 2016-05-12 LAB — POCT URINALYSIS DIP (DEVICE)
Bilirubin Urine: NEGATIVE
Glucose, UA: NEGATIVE mg/dL
HGB URINE DIPSTICK: NEGATIVE
Ketones, ur: NEGATIVE mg/dL
Leukocytes, UA: NEGATIVE
NITRITE: NEGATIVE
PH: 5.5 (ref 5.0–8.0)
Protein, ur: NEGATIVE mg/dL
Specific Gravity, Urine: 1.025 (ref 1.005–1.030)
UROBILINOGEN UA: 0.2 mg/dL (ref 0.0–1.0)

## 2016-05-12 NOTE — ED Triage Notes (Signed)
Back  Pain  radaiting   Around  To front  X  2-3   Weeks  Ago        denys    Any  Nausea   voming  Seen  By  pcp    Pt   On      Anti   biotis      Has   2  Days  left

## 2016-05-12 NOTE — Discharge Instructions (Signed)
The most likely cause for the pain around the lower most abdomen radiating to the back is constipation. Take the MiraLAX as directed 3 glasses within an hour. Repeat in 6 hours if needed. The urinalysis was clear. No blood sugar and no sign of infection. If you continue to have urinary frequency over the next few days call the urologist for an appointment. A urine cytology is pending for STD testing.

## 2016-05-12 NOTE — ED Provider Notes (Signed)
CSN: [id number]     Arrival date & time 05/12/16  1246 History   First MD Initiated Contact with Patient 05/12/16 1442     Chief Complaint  Patient presents with  . Dysuria   (Consider location/radiation/quality/duration/timing/severity/associated sxs/prior Treatment) 31 year old male complaining of frequent urination for a few weeks. He saw his PCP last week and was treated with Bactrim. He had a urinalysis but he does not know the results. The frequency is persistent and rare minor dysuria. Denies penile discharge. Denies scrotal or testicular swelling, pain or tenderness. He states that he has also been having pain from the right and left lower most quadrants that radiates to the right and left lower most back. This is been occurring for at least a couple weeks as well. Nothing seems to make it worse or better. He has been using MiraLAX once daily but having small stools.      Past Medical History:  Diagnosis Date  . Asthma   . Hypertension   . Seasonal allergies    Past Surgical History:  Procedure Laterality Date  . APPENDECTOMY  2001  . HERNIA REPAIR  1992   Family History  Problem Relation Age of Onset  . Hypertension Mother   . Hypertension Father   . High Cholesterol Father   . Diabetes Maternal Grandmother    Social History  Substance Use Topics  . Smoking status: Never Smoker  . Smokeless tobacco: Never Used  . Alcohol use 0.0 oz/week     Comment: 3-4 drinks per week    Review of Systems  Constitutional: Negative.  Negative for fever.  HENT: Negative.   Respiratory: Negative.   Gastrointestinal: Positive for constipation. Negative for nausea and vomiting.       As per history of present illness  Genitourinary: Positive for frequency. Negative for discharge, genital sores, hematuria, penile pain, penile swelling, scrotal swelling, testicular pain and urgency.       As per history of present illness  Neurological: Negative.   All other systems reviewed and  are negative.   Allergies  Patient has no known allergies.  Home Medications   Prior to Admission medications   Medication Sig Start Date End Date Taking? Authorizing Provider  cetirizine (ZYRTEC) 10 MG tablet Take 10 mg by mouth daily.    Historical Provider, MD  Cholecalciferol (VITAMIN D PO) Take 1 tablet by mouth daily.    Historical Provider, MD  ibuprofen (ADVIL,MOTRIN) 800 MG tablet Take 1 tablet (800 mg total) by mouth every 8 (eight) hours as needed for mild pain or moderate pain. 11/02/15   Trixie DredgeEmily West, PA-C  Omega-3 Fatty Acids (FISH OIL PO) Take 1 tablet by mouth daily.    Historical Provider, MD  pantoprazole (PROTONIX) 40 MG tablet Take 40 mg by mouth daily.    Historical Provider, MD  polyethylene glycol (MIRALAX / GLYCOLAX) packet Take 17 g by mouth daily. 11/02/15   Trixie DredgeEmily West, PA-C  TURMERIC PO Take 1 tablet by mouth daily.    Historical Provider, MD   Meds Ordered and Administered this Visit  Medications - No data to display  BP 142/96 (BP Location: Left Arm)   Pulse 78   Temp 98.4 F (36.9 C) (Oral)   Resp 18   SpO2 100%  No data found.   Physical Exam  Constitutional: He is oriented to person, place, and time. He appears well-developed and well-nourished. No distress.  HENT:  Head: Normocephalic and atraumatic.  Eyes: EOM are normal.  Neck:  Normal range of motion. Neck supple.  Cardiovascular: Normal rate, regular rhythm and normal heart sounds.   Pulmonary/Chest: Effort normal. No respiratory distress.  Abdominal: Soft.  Mildly distended but not tense. Percussion is tympanic in all quadrants with a little less in the lower most quadrants. Nontender.  Musculoskeletal: Normal range of motion. He exhibits no edema.  There is no tenderness to the right or left lower back where he states the pain radiates. No CVAT. Pain is not reproducible.  Neurological: He is alert and oriented to person, place, and time. He exhibits normal muscle tone.  Skin: Skin is warm  and dry.  Psychiatric: He has a normal mood and affect. His behavior is normal. Thought content normal.  Nursing note and vitals reviewed.   Urgent Care Course     Procedures (including critical care time)  Labs Review Labs Reviewed  POCT URINALYSIS DIP (DEVICE)  URINE CYTOLOGY ANCILLARY ONLY   Results for orders placed or performed during the hospital encounter of 05/12/16  POCT urinalysis dip (device)  Result Value Ref Range   Glucose, UA NEGATIVE NEGATIVE mg/dL   Bilirubin Urine NEGATIVE NEGATIVE   Ketones, ur NEGATIVE NEGATIVE mg/dL   Specific Gravity, Urine 1.025 1.005 - 1.030   Hgb urine dipstick NEGATIVE NEGATIVE   pH 5.5 5.0 - 8.0   Protein, ur NEGATIVE NEGATIVE mg/dL   Urobilinogen, UA 0.2 0.0 - 1.0 mg/dL   Nitrite NEGATIVE NEGATIVE   Leukocytes, UA NEGATIVE NEGATIVE     Imaging Review No results found.   Visual Acuity Review  Right Eye Distance:   Left Eye Distance:   Bilateral Distance:    Right Eye Near:   Left Eye Near:    Bilateral Near:         MDM   1. Slow transit constipation   2. Urinary frequency    The most likely cause for the pain around the lower most abdomen radiating to the back is constipation. Take the MiraLAX as directed 3 glasses within an hour. Repeat in 6 hours if needed. The urinalysis was clear. No blood sugar and no sign of infection. If you continue to have urinary frequency over the next few days call the urologist for an appointment. A urine cytology is pending for STD testing.     Hayden Rasmussen, NP 05/12/16 [phone]

## 2016-05-14 LAB — URINE CYTOLOGY ANCILLARY ONLY
CHLAMYDIA, DNA PROBE: NEGATIVE
NEISSERIA GONORRHEA: NEGATIVE
TRICH (WINDOWPATH): NEGATIVE

## 2017-06-21 IMAGING — CT CT ABD-PELV W/ CM
2 of 4 series · 15 of 46 positions shown, 17 images · IV contrast (iopamidol)
Comparison: None.

CLINICAL DATA: Pt complains of abdominal pain for the past week. Pt
denies nausea, vomiting, and diarrhea. Pt was put on dicyclomine 1
week ago, which he states has not helped.

EXAM:
CT ABDOMEN AND PELVIS WITH CONTRAST
TECHNIQUE: Multidetector CT imaging of the abdomen and pelvis was performed
using the standard protocol following bolus administration of
intravenous contrast.
CONTRAST:  15mL GTA07Q-133 IOPAMIDOL (GTA07Q-133) INJECTION 61%,
100mL GTA07Q-133 IOPAMIDOL (GTA07Q-133) INJECTION 61%

[Series 2: abd/pel with · axial · 0.74mm/px · z∈[+946,+1386]mm · 12 of 98 slices shown, 14 images]
[im 5/98  soft-tissue]
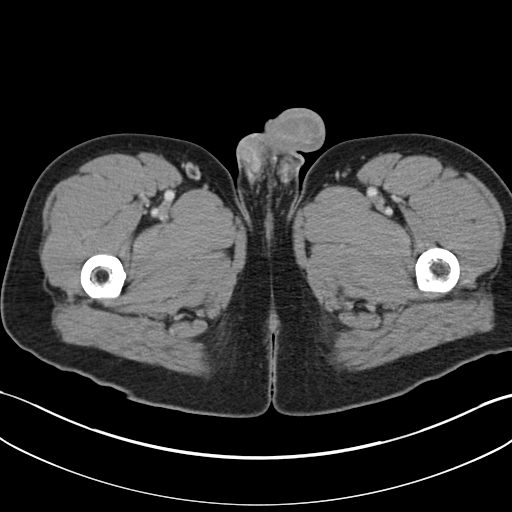
[im 5/98  bone]
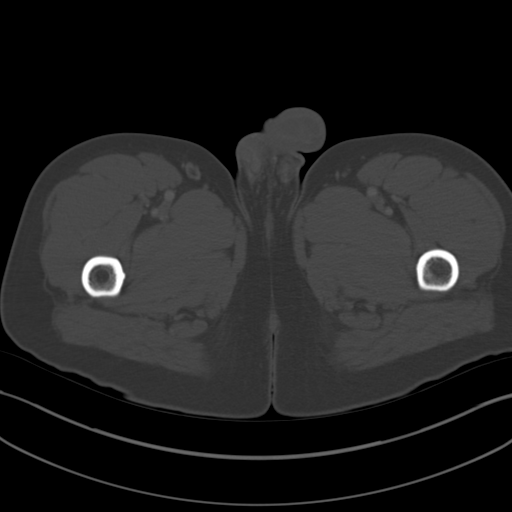
[im 14/98  soft-tissue]
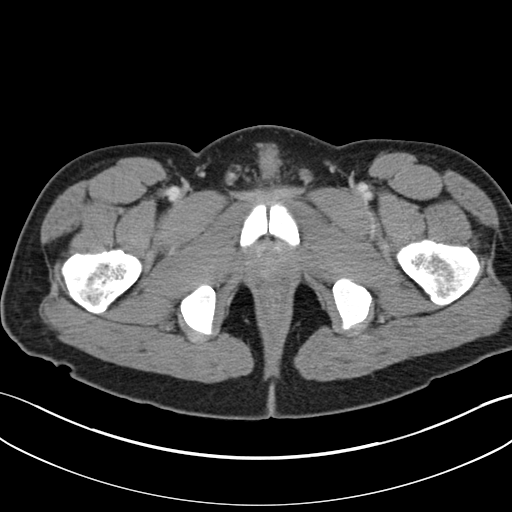
[im 24/98  soft-tissue]
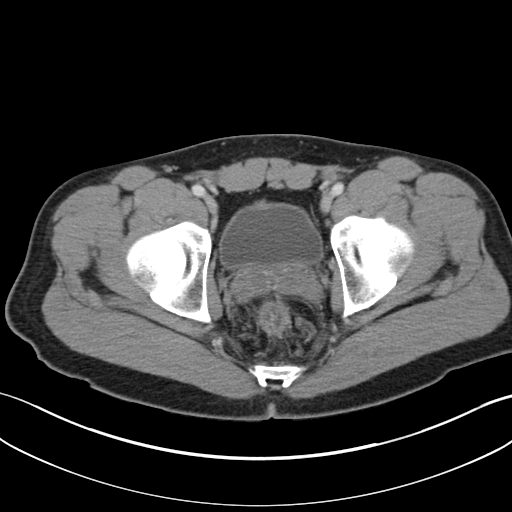
[im 28/98  soft-tissue]
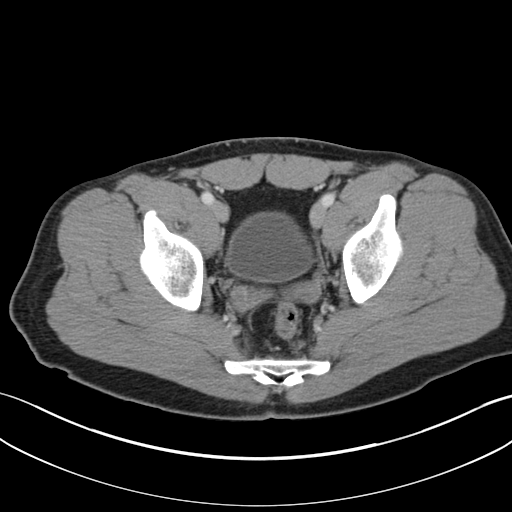
[im 37/98  soft-tissue]
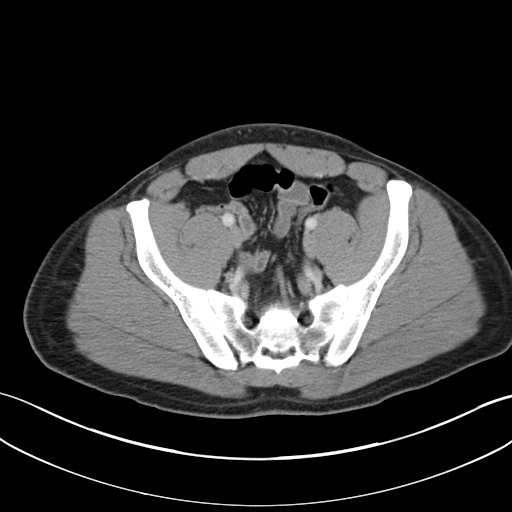
[im 47/98  soft-tissue]
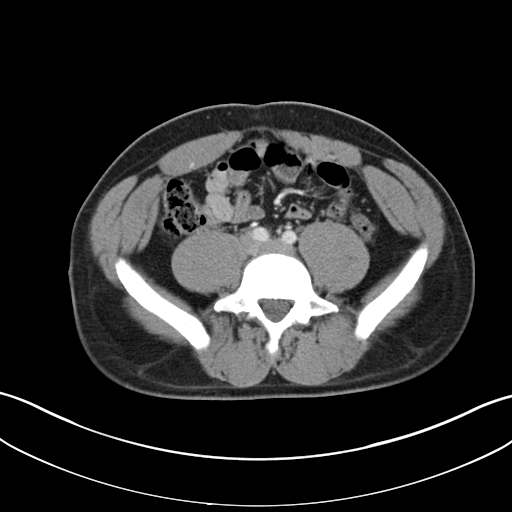
[im 51/98  soft-tissue]
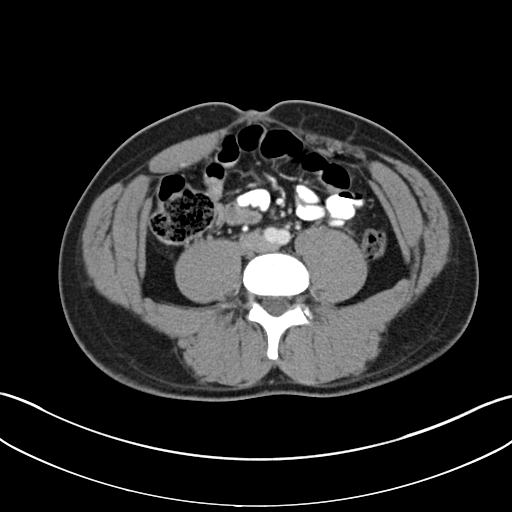
[im 61/98  soft-tissue]
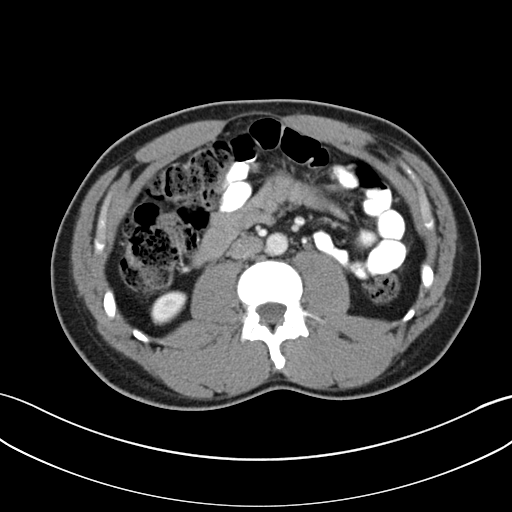
[im 70/98  soft-tissue]
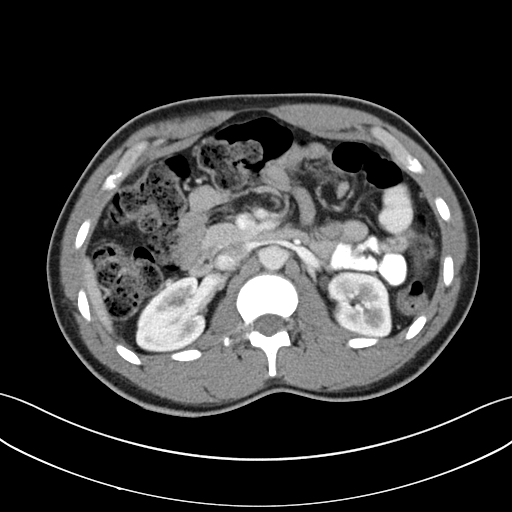
[im 70/98  bone]
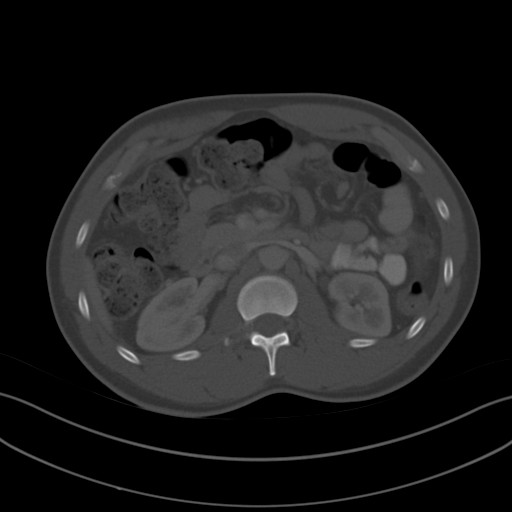
[im 74/98  soft-tissue]
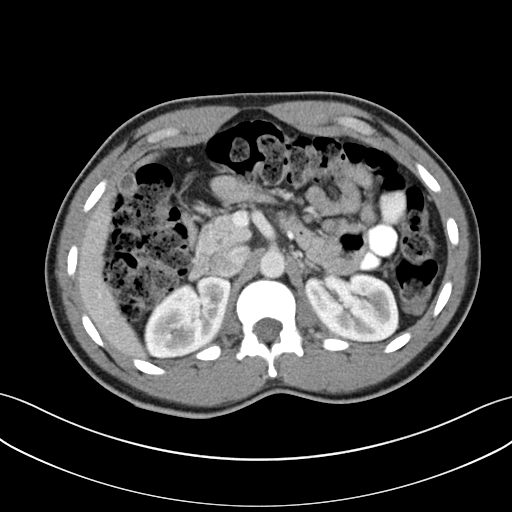
[im 84/98  soft-tissue]
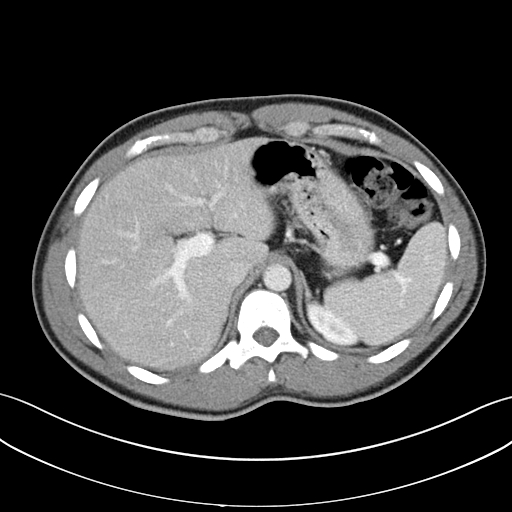
[im 93/98  soft-tissue]
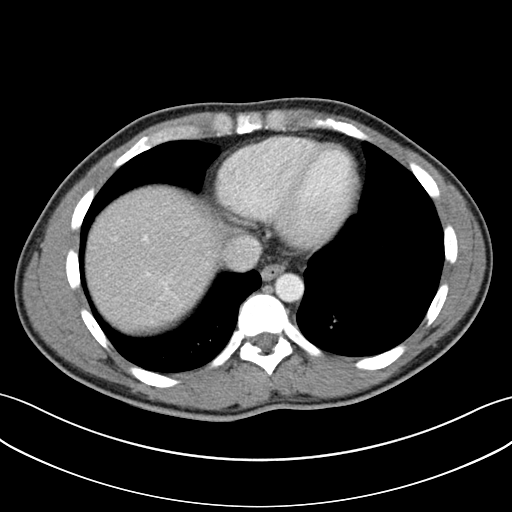

[Series 5: coronal a/|p · coronal · 0.63mm/px · 3 of 123 slices shown]
[im 41/123  soft-tissue]
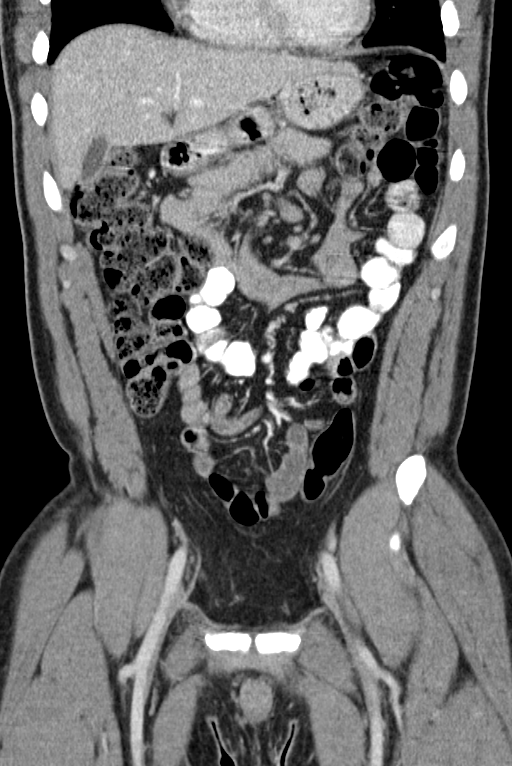
[im 55/123  soft-tissue]
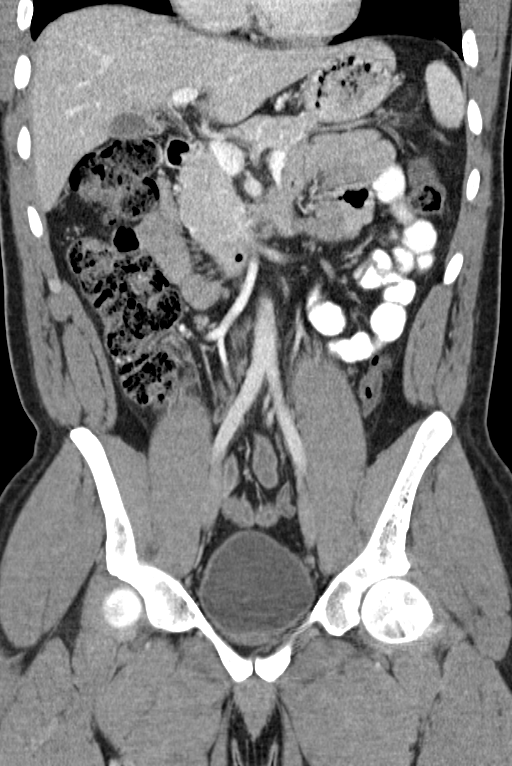
[im 68/123  soft-tissue]
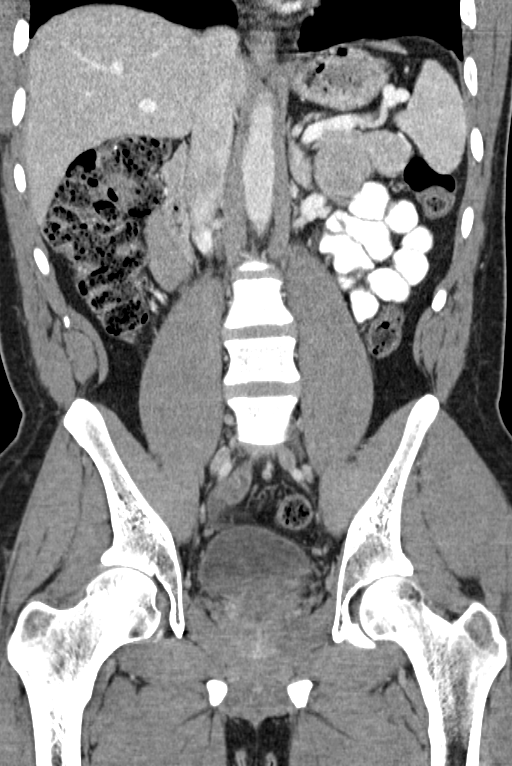

[15 of 46 positions shown; findings below may reference images not displayed]

FINDINGS: The lung bases are clear.

Tiny sub cm low-attenuation lesion in segment 5 of the liver is too
small to characterize but likely represents a cyst. The gallbladder,
pancreas, spleen, adrenal glands, kidneys, abdominal aorta, inferior
vena cava, and retroperitoneal lymph nodes are unremarkable.
Stomach, small bowel, and colon are not abnormally distended. Stool
fills the colon. No free air or free fluid in the abdomen.

Pelvis: Appendix is surgically absent. Prostate gland is enlarged at
4.7 cm and seminal vesicles are prominent with somewhat
heterogeneous appearance. Consider possibility of prostatitis. No
pelvic lymphadenopathy. No free or loculated pelvic fluid
collections. Bladder wall is not thickened. No evidence of
diverticulitis. No destructive bone lesions.
IMPRESSION: No evidence of bowel obstruction or inflammation. Stool-filled colon
may indicate constipation. Enlarged and somewhat heterogeneous
prostate gland could indicate prostatitis.
